# Patient Record
Sex: Female | Born: 1970 | Race: Black or African American | Hispanic: No | Marital: Married | Smoking: Current every day smoker
Health system: Southern US, Community
[De-identification: ages and names within clinical notes are randomized; demographics above are authoritative.]

## PROBLEM LIST (undated history)

## (undated) DIAGNOSIS — M549 Dorsalgia, unspecified: Secondary | ICD-10-CM

## (undated) DIAGNOSIS — I10 Essential (primary) hypertension: Secondary | ICD-10-CM

## (undated) DIAGNOSIS — F17200 Nicotine dependence, unspecified, uncomplicated: Secondary | ICD-10-CM

## (undated) DIAGNOSIS — E119 Type 2 diabetes mellitus without complications: Secondary | ICD-10-CM

## (undated) DIAGNOSIS — M419 Scoliosis, unspecified: Secondary | ICD-10-CM

## (undated) DIAGNOSIS — K219 Gastro-esophageal reflux disease without esophagitis: Secondary | ICD-10-CM

## (undated) DIAGNOSIS — E7800 Pure hypercholesterolemia, unspecified: Secondary | ICD-10-CM

## (undated) HISTORY — DX: Nicotine dependence, unspecified, uncomplicated: F17.200

## (undated) HISTORY — PX: ABDOMINAL HYSTERECTOMY: SHX81

## (undated) HISTORY — DX: Type 2 diabetes mellitus without complications: E11.9

## (undated) HISTORY — PX: ABDOMINAL SURGERY: SHX537

## (undated) HISTORY — PX: HAND SURGERY: SHX662

---

## 1997-11-13 ENCOUNTER — Emergency Department (HOSPITAL_COMMUNITY): Admission: EM | Admit: 1997-11-13 | Discharge: 1997-11-13 | Payer: Self-pay | Admitting: Emergency Medicine

## 1998-06-07 ENCOUNTER — Emergency Department (HOSPITAL_COMMUNITY): Admission: EM | Admit: 1998-06-07 | Discharge: 1998-06-07 | Payer: Self-pay | Admitting: Internal Medicine

## 1998-10-20 ENCOUNTER — Emergency Department (HOSPITAL_COMMUNITY): Admission: EM | Admit: 1998-10-20 | Discharge: 1998-10-20 | Payer: Self-pay | Admitting: Emergency Medicine

## 1999-06-28 ENCOUNTER — Emergency Department (HOSPITAL_COMMUNITY): Admission: EM | Admit: 1999-06-28 | Discharge: 1999-06-28 | Payer: Self-pay | Admitting: Emergency Medicine

## 1999-07-04 ENCOUNTER — Ambulatory Visit (HOSPITAL_COMMUNITY): Admission: RE | Admit: 1999-07-04 | Discharge: 1999-07-04 | Payer: Self-pay | Admitting: Internal Medicine

## 1999-07-04 ENCOUNTER — Encounter: Payer: Self-pay | Admitting: Internal Medicine

## 2000-05-28 ENCOUNTER — Other Ambulatory Visit: Admission: RE | Admit: 2000-05-28 | Discharge: 2000-05-28 | Payer: Self-pay | Admitting: Obstetrics and Gynecology

## 2003-06-19 ENCOUNTER — Emergency Department (HOSPITAL_COMMUNITY): Admission: EM | Admit: 2003-06-19 | Discharge: 2003-06-19 | Payer: Self-pay | Admitting: Emergency Medicine

## 2004-04-30 ENCOUNTER — Emergency Department (HOSPITAL_COMMUNITY): Admission: EM | Admit: 2004-04-30 | Discharge: 2004-04-30 | Payer: Self-pay | Admitting: Emergency Medicine

## 2004-05-30 ENCOUNTER — Ambulatory Visit (HOSPITAL_COMMUNITY): Admission: RE | Admit: 2004-05-30 | Discharge: 2004-05-30 | Payer: Self-pay | Admitting: Obstetrics and Gynecology

## 2004-08-27 ENCOUNTER — Emergency Department (HOSPITAL_COMMUNITY): Admission: EM | Admit: 2004-08-27 | Discharge: 2004-08-27 | Payer: Self-pay | Admitting: Emergency Medicine

## 2005-01-14 ENCOUNTER — Emergency Department (HOSPITAL_COMMUNITY): Admission: EM | Admit: 2005-01-14 | Discharge: 2005-01-14 | Payer: Self-pay | Admitting: Emergency Medicine

## 2007-02-06 ENCOUNTER — Emergency Department (HOSPITAL_COMMUNITY): Admission: EM | Admit: 2007-02-06 | Discharge: 2007-02-06 | Payer: Self-pay | Admitting: Emergency Medicine

## 2007-05-19 ENCOUNTER — Emergency Department (HOSPITAL_COMMUNITY): Admission: EM | Admit: 2007-05-19 | Discharge: 2007-05-19 | Payer: Self-pay | Admitting: Emergency Medicine

## 2008-10-30 ENCOUNTER — Emergency Department (HOSPITAL_COMMUNITY): Admission: EM | Admit: 2008-10-30 | Discharge: 2008-10-30 | Payer: Self-pay | Admitting: Emergency Medicine

## 2009-03-15 ENCOUNTER — Ambulatory Visit: Payer: Self-pay | Admitting: Internal Medicine

## 2009-03-15 ENCOUNTER — Encounter (INDEPENDENT_AMBULATORY_CARE_PROVIDER_SITE_OTHER): Payer: Self-pay | Admitting: Family Medicine

## 2009-03-15 LAB — CONVERTED CEMR LAB
ALT: 12 units/L (ref 0–35)
AST: 16 units/L (ref 0–37)
Basophils Relative: 1 % (ref 0–1)
Bilirubin, Direct: 0.1 mg/dL (ref 0.0–0.3)
Calcium: 9.6 mg/dL (ref 8.4–10.5)
Chloride: 107 meq/L (ref 96–112)
Creatinine, Ser: 0.65 mg/dL (ref 0.40–1.20)
Eosinophils Absolute: 0.3 10*3/uL (ref 0.0–0.7)
Hemoglobin: 13.7 g/dL (ref 12.0–15.0)
Lymphs Abs: 3.4 10*3/uL (ref 0.7–4.0)
MCV: 88.8 fL (ref 78.0–100.0)
Phosphorus: 3.5 mg/dL (ref 2.3–4.6)
Potassium: 4.3 meq/L (ref 3.5–5.3)
RBC: 4.63 M/uL (ref 3.87–5.11)
Total Bilirubin: 0.5 mg/dL (ref 0.3–1.2)
WBC: 6 10*3/uL (ref 4.0–10.5)

## 2009-04-12 ENCOUNTER — Encounter (INDEPENDENT_AMBULATORY_CARE_PROVIDER_SITE_OTHER): Payer: Self-pay | Admitting: Family Medicine

## 2009-04-12 ENCOUNTER — Ambulatory Visit: Payer: Self-pay | Admitting: Internal Medicine

## 2009-04-12 LAB — CONVERTED CEMR LAB
AST: 22 units/L (ref 0–37)
Albumin: 4.3 g/dL (ref 3.5–5.2)
Chloride: 103 meq/L (ref 96–112)
Creatinine, Ser: 0.6 mg/dL (ref 0.40–1.20)
Total Bilirubin: 0.5 mg/dL (ref 0.3–1.2)
VLDL: 25 mg/dL (ref 0–40)

## 2009-06-09 ENCOUNTER — Emergency Department (HOSPITAL_COMMUNITY): Admission: EM | Admit: 2009-06-09 | Discharge: 2009-06-09 | Payer: Self-pay | Admitting: Emergency Medicine

## 2009-06-09 ENCOUNTER — Ambulatory Visit: Payer: Self-pay | Admitting: Internal Medicine

## 2009-07-01 ENCOUNTER — Encounter (INDEPENDENT_AMBULATORY_CARE_PROVIDER_SITE_OTHER): Payer: Self-pay | Admitting: Family Medicine

## 2009-07-01 ENCOUNTER — Ambulatory Visit: Payer: Self-pay | Admitting: Internal Medicine

## 2009-07-01 LAB — CONVERTED CEMR LAB
BUN: 11 mg/dL (ref 6–23)
CO2: 22 meq/L (ref 19–32)
Chloride: 104 meq/L (ref 96–112)
Glucose, Bld: 95 mg/dL (ref 70–99)
Potassium: 3.6 meq/L (ref 3.5–5.3)

## 2009-10-04 ENCOUNTER — Ambulatory Visit: Payer: Self-pay | Admitting: Internal Medicine

## 2009-10-04 ENCOUNTER — Encounter (INDEPENDENT_AMBULATORY_CARE_PROVIDER_SITE_OTHER): Payer: Self-pay | Admitting: Family Medicine

## 2009-10-04 LAB — CONVERTED CEMR LAB
BUN: 14 mg/dL (ref 6–23)
Basophils Relative: 1 % (ref 0–1)
Calcium: 9.7 mg/dL (ref 8.4–10.5)
Chloride: 104 meq/L (ref 96–112)
Creatinine, Ser: 0.6 mg/dL (ref 0.40–1.20)
Eosinophils Absolute: 0.1 10*3/uL (ref 0.0–0.7)
Glucose, Bld: 103 mg/dL — ABNORMAL HIGH (ref 70–99)
HDL: 40 mg/dL (ref >39)
Lymphocytes Relative: 45 % (ref 12–46)
Monocytes Absolute: 0.3 10*3/uL (ref 0.1–1.0)
Neutro Abs: 2.5 10*3/uL (ref 1.7–7.7)
Platelets: 332 10*3/uL (ref 150–400)
Potassium: 3.5 meq/L (ref 3.5–5.3)
RDW: 14.6 % (ref 11.5–15.5)
VLDL: 22 mg/dL (ref 0–40)

## 2009-10-08 ENCOUNTER — Encounter: Admission: RE | Admit: 2009-10-08 | Discharge: 2009-10-08 | Payer: Self-pay | Admitting: Internal Medicine

## 2009-10-13 ENCOUNTER — Ambulatory Visit (HOSPITAL_COMMUNITY): Admission: RE | Admit: 2009-10-13 | Discharge: 2009-10-13 | Payer: Self-pay | Admitting: Internal Medicine

## 2009-11-18 ENCOUNTER — Encounter (INDEPENDENT_AMBULATORY_CARE_PROVIDER_SITE_OTHER): Payer: Self-pay | Admitting: Family Medicine

## 2009-11-18 LAB — CONVERTED CEMR LAB
ALT: 16 units/L (ref 0–35)
HDL: 37 mg/dL — ABNORMAL LOW (ref >39)
Triglycerides: 111 mg/dL (ref <150)

## 2009-12-14 ENCOUNTER — Emergency Department (HOSPITAL_COMMUNITY): Admission: EM | Admit: 2009-12-14 | Discharge: 2009-12-14 | Payer: Self-pay | Admitting: Family Medicine

## 2010-01-10 ENCOUNTER — Encounter (INDEPENDENT_AMBULATORY_CARE_PROVIDER_SITE_OTHER): Payer: Self-pay | Admitting: *Deleted

## 2010-01-21 ENCOUNTER — Inpatient Hospital Stay (HOSPITAL_COMMUNITY)
Admission: AD | Admit: 2010-01-21 | Discharge: 2010-01-21 | Payer: Self-pay | Source: Home / Self Care | Admitting: Obstetrics & Gynecology

## 2010-02-23 ENCOUNTER — Ambulatory Visit: Admit: 2010-02-23 | Payer: Self-pay | Admitting: Obstetrics & Gynecology

## 2010-03-12 ENCOUNTER — Other Ambulatory Visit: Payer: Self-pay | Admitting: Family Medicine

## 2010-03-12 DIAGNOSIS — N6310 Unspecified lump in the right breast, unspecified quadrant: Secondary | ICD-10-CM

## 2010-03-13 ENCOUNTER — Encounter: Payer: Self-pay | Admitting: Orthopedic Surgery

## 2010-03-13 ENCOUNTER — Encounter: Payer: Self-pay | Admitting: Internal Medicine

## 2010-03-25 ENCOUNTER — Other Ambulatory Visit: Payer: Self-pay | Admitting: Family Medicine

## 2010-03-25 ENCOUNTER — Ambulatory Visit
Admission: RE | Admit: 2010-03-25 | Discharge: 2010-03-25 | Disposition: A | Discharge status: Home / Self Care | Payer: BC Managed Care – PPO | Source: Ambulatory Visit | Attending: *Deleted | Admitting: *Deleted

## 2010-03-25 DIAGNOSIS — N63 Unspecified lump in unspecified breast: Secondary | ICD-10-CM

## 2010-03-25 DIAGNOSIS — N6310 Unspecified lump in the right breast, unspecified quadrant: Secondary | ICD-10-CM

## 2010-05-03 LAB — GC/CHLAMYDIA PROBE AMP, GENITAL: GC Probe Amp, Genital: NEGATIVE

## 2010-05-03 LAB — URINALYSIS, ROUTINE W REFLEX MICROSCOPIC
Bilirubin Urine: NEGATIVE
Glucose, UA: NEGATIVE mg/dL
Ketones, ur: NEGATIVE mg/dL
Leukocytes, UA: NEGATIVE
Specific Gravity, Urine: >1.03 — ABNORMAL HIGH (ref 1.005–1.030)

## 2010-05-03 LAB — CBC
HCT: 39.7 % (ref 36.0–46.0)
MCV: 89.7 fL (ref 78.0–100.0)
WBC: 6 10*3/uL (ref 4.0–10.5)

## 2010-05-03 LAB — WET PREP, GENITAL
Clue Cells Wet Prep HPF POC: NONE SEEN
Trich, Wet Prep: NONE SEEN
Yeast Wet Prep HPF POC: NONE SEEN

## 2010-05-06 ENCOUNTER — Encounter (HOSPITAL_COMMUNITY)
Admission: RE | Admit: 2010-05-06 | Discharge: 2010-05-06 | Disposition: A | Discharge status: Home / Self Care | Payer: BC Managed Care – PPO | Source: Ambulatory Visit | Attending: Obstetrics and Gynecology | Admitting: Obstetrics and Gynecology

## 2010-05-06 LAB — CBC
MCH: 29.1 pg (ref 26.0–34.0)
MCHC: 33.3 g/dL (ref 30.0–36.0)
MCV: 87.4 fL (ref 78.0–100.0)
Platelets: 305 10*3/uL (ref 150–400)

## 2010-05-06 LAB — DIFFERENTIAL
Eosinophils Relative: 3 % (ref 0–5)
Lymphocytes Relative: 44 % (ref 12–46)
Lymphs Abs: 2.4 10*3/uL (ref 0.7–4.0)
Monocytes Absolute: 0.4 10*3/uL (ref 0.1–1.0)
Monocytes Relative: 7 % (ref 3–12)
Neutro Abs: 2.5 10*3/uL (ref 1.7–7.7)

## 2010-05-06 LAB — COMPREHENSIVE METABOLIC PANEL
CO2: 25 mEq/L (ref 19–32)
Calcium: 9 mg/dL (ref 8.4–10.5)
Creatinine, Ser: 0.54 mg/dL (ref 0.4–1.2)
GFR calc non Af Amer: >60 mL/min (ref >60)
Glucose, Bld: 97 mg/dL (ref 70–99)

## 2010-05-06 LAB — SURGICAL PCR SCREEN: Staphylococcus aureus: NEGATIVE

## 2010-05-10 LAB — DIFFERENTIAL
Basophils Relative: 1 % (ref 0–1)
Monocytes Relative: 7 % (ref 3–12)
Neutro Abs: 3.1 10*3/uL (ref 1.7–7.7)
Neutrophils Relative %: 50 % (ref 43–77)

## 2010-05-10 LAB — BASIC METABOLIC PANEL
CO2: 24 mEq/L (ref 19–32)
Calcium: 9.2 mg/dL (ref 8.4–10.5)
Creatinine, Ser: 0.62 mg/dL (ref 0.4–1.2)
GFR calc Af Amer: >60 mL/min (ref >60)

## 2010-05-10 LAB — URINALYSIS, ROUTINE W REFLEX MICROSCOPIC
Protein, ur: NEGATIVE mg/dL
Urobilinogen, UA: 1 mg/dL (ref 0.0–1.0)

## 2010-05-10 LAB — CBC
MCHC: 34.6 g/dL (ref 30.0–36.0)
Platelets: 286 10*3/uL (ref 150–400)
RBC: 4.35 MIL/uL (ref 3.87–5.11)
WBC: 6.3 10*3/uL (ref 4.0–10.5)

## 2010-05-10 LAB — POCT CARDIAC MARKERS
CKMB, poc: <1 ng/mL — ABNORMAL LOW (ref 1.0–8.0)
Myoglobin, poc: 35.5 ng/mL (ref 12–200)
Myoglobin, poc: 39.3 ng/mL (ref 12–200)

## 2010-05-12 ENCOUNTER — Inpatient Hospital Stay (HOSPITAL_COMMUNITY)
Admission: RE | Admit: 2010-05-12 | Discharge: 2010-05-15 | DRG: 359 | Disposition: A | Discharge status: Home / Self Care | Payer: BC Managed Care – PPO | Source: Ambulatory Visit | Attending: Obstetrics and Gynecology | Admitting: Obstetrics and Gynecology

## 2010-05-12 ENCOUNTER — Other Ambulatory Visit: Payer: Self-pay | Admitting: Obstetrics and Gynecology

## 2010-05-12 DIAGNOSIS — IMO0002 Reserved for concepts with insufficient information to code with codable children: Secondary | ICD-10-CM | POA: Diagnosis present

## 2010-05-12 DIAGNOSIS — N803 Endometriosis of pelvic peritoneum, unspecified: Secondary | ICD-10-CM | POA: Diagnosis present

## 2010-05-12 DIAGNOSIS — D251 Intramural leiomyoma of uterus: Principal | ICD-10-CM | POA: Diagnosis present

## 2010-05-12 DIAGNOSIS — N9489 Other specified conditions associated with female genital organs and menstrual cycle: Secondary | ICD-10-CM | POA: Diagnosis present

## 2010-05-12 DIAGNOSIS — Z01818 Encounter for other preprocedural examination: Secondary | ICD-10-CM

## 2010-05-12 DIAGNOSIS — Z01812 Encounter for preprocedural laboratory examination: Secondary | ICD-10-CM

## 2010-05-12 DIAGNOSIS — N8000 Endometriosis of the uterus, unspecified: Secondary | ICD-10-CM | POA: Diagnosis present

## 2010-05-12 DIAGNOSIS — N946 Dysmenorrhea, unspecified: Secondary | ICD-10-CM | POA: Diagnosis present

## 2010-05-12 DIAGNOSIS — N920 Excessive and frequent menstruation with regular cycle: Secondary | ICD-10-CM | POA: Diagnosis present

## 2010-05-12 DIAGNOSIS — D252 Subserosal leiomyoma of uterus: Secondary | ICD-10-CM | POA: Diagnosis present

## 2010-05-12 LAB — CBC
HCT: 40.2 % (ref 36.0–46.0)
Hemoglobin: 13.3 g/dL (ref 12.0–15.0)
MCV: 87.4 fL (ref 78.0–100.0)
RBC: 4.6 MIL/uL (ref 3.87–5.11)
WBC: 17.9 10*3/uL — ABNORMAL HIGH (ref 4.0–10.5)

## 2010-05-13 LAB — CBC
HCT: 35.6 % — ABNORMAL LOW (ref 36.0–46.0)
Hemoglobin: 11.5 g/dL — ABNORMAL LOW (ref 12.0–15.0)
MCH: 28.5 pg (ref 26.0–34.0)
MCHC: 32.3 g/dL (ref 30.0–36.0)
MCV: 88.1 fL (ref 78.0–100.0)
RBC: 4.04 MIL/uL (ref 3.87–5.11)

## 2010-05-15 ENCOUNTER — Ambulatory Visit (HOSPITAL_COMMUNITY): Payer: BC Managed Care – PPO

## 2010-05-26 NOTE — Op Note (Signed)
NAMEMORAYMA, [name]                 ACCOUNT NO.:  [id number]  MEDICAL RECORD NO.:  [id number]           PATIENT TYPE:  O  LOCATION:  9302                          FACILITY:  [facility]  PHYSICIAN:  Malachi Pro. Ambrose Mantle, M.D. DATE OF BIRTH:  Feb 17, 1971  DATE OF PROCEDURE:  05/12/2010 DATE OF DISCHARGE:                              OPERATIVE REPORT   PREOPERATIVE DIAGNOSES:  Leiomyomata uteri, menorrhagia, dysmenorrhea, dyspareunia, abnormal uterine bleeding, history of endometriosis noted at the time of prior laparoscopy.  POSTOPERATIVE DIAGNOSES:  Leiomyomata uteri, menorrhagia, dysmenorrhea, dyspareunia, abnormal uterine bleeding, history of endometriosis noted at the time of prior laparoscopy.  OPERATIONS:  Abdominal hysterectomy, division of adhesions.  OPERATOR:  Malachi Pro. Ambrose Mantle, MD  ASSISTANT:  Zenaida Niece, MD  ANESTHESIA:  General anesthesia.  The patient was brought to the operating room and placed on the operating room table.  One look with the laryngoscope showed that it was a difficult intubation, so the laryngoscope was removed and video laryngoscope was used and no difficulty in intubating the patient.  The patient was placed in frog-leg position.  The abdomen, vulva, vagina and urethra were prepped with Betadine solution and a Foley catheter was inserted to straight drain.  Exam revealed what was thought to be a 14- 16 weeks' size uterus.  The adnexa were free of masses.  The patient was placed supine.  The abdomen was draped as a sterile field.  I had told the patient that I would use an incision slightly above her old incision to avoid being in the crease of her abdominal wall.  I made an incision on the upper flap of the abdominal wall, carried in layers through the skin and subcutaneous tissue and down to the fascia.  There was a little bit of scarring from her previous surgery.  The fascia was incised transversely, separated from the rectus muscle inferiorly  and superiorly.  Peritoneum was opened vertically.  I placed an Teacher, early years/pre.  First time, I thought I had some bowel or omentum cult so I used a retractor to place it of the second time.  Then I elevated the uterus out of the pelvic cavity onto the abdominal wall.  I used four packs to pack away the bowel which kept nudging into the operative field.  Inspection of the uterus revealed that the uterus was markedly enlarged with fibroids.  The right tube and ovary appeared relatively normal.  The left proximal section of the left tube seemed to be absent. The left ovary appeared normal, but there was some scarring around the tube and ovary on the left and these were divided.  There were some cul- de-sac adhesions that will later divided.  The upper pedicles were clamped across after the round ligaments bilaterally were divided with the Bovie and a bladder flap was developed.  A double ligature was placed across each upper pedicle.  The uterine vessels were then skeletonized bilaterally and skeletonizing the right uterine vessel actually ran into some bleeding that I needed to suture.  The parametrial and paracervical tissues were clamped, cut and suture ligated.  At this  point, the cul-de-sac adhesions were divided.  The uterosacral ligaments were clamped, cut, suture ligated and held and the vaginal angles were entered. Vaginal angle sutures were placed in the central portion of the vagina was closed with interrupted figure-of- eight sutures of 0 Vicryl.  There was no significant bleeding found.  I reapproximated the uterosacral ligaments in the midline with sutures through the uterosacral ligaments and the cul-de-sac peritoneum.  I then reperitonealized over the vaginal cuff.  I tended to what little bleeding there was and then I removed the Alexis retractor and the packs and then realized that I had not identified the ovaries, so I used some retractors to clear the pelvic walls,  identified both ureters normal in course and contour, normal in caliber.  At this point, there was some bleeding noted close to the left ovary which I sutured with 3-0 Vicryl with good hemostasis.  No other bleeding points were found.  I then closed the abdominal wall with interrupted sutures of 0 Vicryl to include the rectus muscle and the peritoneum, 2 running sutures of 0 Vicryl on the fascia.  A running 3-0 Vicryl on the subcu tissue and staples on the skin.  The patient seemed to tolerate the procedure well. Blood loss was estimated about 400 mL.  Sponge and needle counts were correct and she was returned to recovery in satisfactory condition.     Malachi Pro. Ambrose Mantle, M.D.     TFH/MEDQ  D:  05/12/2010  T:  05/12/2010  Job:  [id number]  Electronically Signed by Tracey Harries M.D. on 05/26/2010 08:48:28 AM

## 2010-05-26 NOTE — Discharge Summary (Signed)
[name], [name]                 ACCOUNT NO.:  [id number]  MEDICAL RECORD NO.:  [id number]           PATIENT TYPE:  O  LOCATION:  9302                          FACILITY:  [facility]  PHYSICIAN:  Malachi Pro. Ambrose Mantle, M.D. DATE OF BIRTH:  1971-01-12  DATE OF ADMISSION:  05/12/2010 DATE OF DISCHARGE:  05/15/2010                              DISCHARGE SUMMARY   This is a 40 year old black female who is admitted for fibroids, menorrhagia, dysmenorrhea, dyspareunia, abnormal uterine bleeding, and a history of endometriosis for abdominal hysterectomy.  She underwent abdominal hysterectomy with division of adhesions by Dr. Ambrose Mantle with Dr. Jackelyn Knife assisting under general anesthesia.  Blood loss was thought to be about 400 mL.  Findings were a fibroid uterus that in the operating room weighed 590 g.  There was cul-de-sac endometriosis that had been seen years before the time of her tubal ligation.  Normal ovaries. Proximal portion of the left tube absent.  Cul-de-sac adhesions were noted.  Postoperatively on the day of surgery, the patient had much more pain than I would expect.  I evaluated her.  Her blood pressures were in the normal range.  Her pulse was fast at 104-115.  Her urine output was excellent.  Abdomen was hard to examine, but did not appear distended, but it was slightly tender.  Blood count showed no evidence of hypovolemia.  We controlled the pain by increasing her morphine dose which was ineffective and then switching her to Dilaudid.  On the evening of the day of surgery, a temperature max was 100.5, on this evening of the first postop day it was 100.7.  The patient felt much better.  She was tolerating a diet, ambulating well, passing flatus, and voiding well.  Because of the elevated temp, I actually added doxycycline to her regimen of the Unasyn.  On the third postop day, she was afebrile, but since the evening prior she had experienced cramping abdominal pains and had quit  passing flatus, so I will order to flat and upright abdominal film which showed only colonic ileus.  No evidence of small bowel obstruction and the patient subsequently began to pass flatus.  She was considered ready for discharge and she was discharged to return to my office in 2 days for followup examination.  Staples were left in place.  The patient's initial hemoglobin was 13.4, hematocrit 40.2, white count 5500, platelet count 305,000, 45 segs, and 44 lymphs. Comprehensive metabolic profile was normal except for potassium of 3.4. Urine pregnancy test was negative.  On the day of surgery when she was having such severe pain, the hemoglobin was repeated and was 13.3 and hematocrit 40.2 and on the first postop day it was 11.5 and hematocrit 35.6.  Path report showed benign leiomyomata up to 7 cm in diameter, secretory phase endometrium, uterine adenomyosis, benign cervix, serosal endometriotic implants with calcified endosalpingiosis, and serosal adhesions.  OPERATION:  Abdominal hysterectomy, division of adhesions.  Flat and upright abdominal film on the day of discharge showed moderate stool and gas throughout and mildly dilated loops of colon.  There was paucity of small bowel gas, surgical  clips overlaid the lower abdomen, gas and stool was seen in the rectum.  IMPRESSION:  Findings most consistent with colonic ileus.  No evidence for small bowel obstruction.  FINAL DIAGNOSES: 1. Leiomyomata uteri. 2. Menorrhagia. 3. Dysmenorrhea. 4. Abnormal uterine bleeding. 5. Dyspareunia. 6. History of endometriosis. 7. Adenomyosis. 8. Serosal endometriosis.  OPERATION:  Abdominal hysterectomy and division of adhesions.  DISCHARGE MEDICATIONS:  Continue her Protonix, metformin, hydrochlorothiazide, and Lipitor.  Take doxycycline 100 mg twice daily for 5 days and Percocet 5/325, 30 tablets, 1 every 4-6 hours as needed for pain.  She is to return to the office in 2 days for followup  examination and removal of staples.  She is to report any fever above 104.4 degrees, report any unusual problems, avoid heavy lifting or strenuous activity, and no vaginal entrance.     Malachi Pro. Ambrose Mantle, M.D.     TFH/MEDQ  D:  05/15/2010  T:  05/16/2010  Job:  [id number]  Electronically Signed by Tracey Harries M.D. on 05/26/2010 08:48:16 AM

## 2010-05-26 NOTE — H&P (Addendum)
[name], [name]                 ACCOUNT NO.:  [id number]  MEDICAL RECORD NO.:  [id number]           PATIENT TYPE:  O  LOCATION:  SDC                           FACILITY:  [facility]  PHYSICIAN:  [name], M.D. DATE OF BIRTH:  10/26/1970  DATE OF ADMISSION:  05/12/2010 DATE OF DISCHARGE:                             HISTORY & PHYSICAL   PRESENT ILLNESS:  This is a 40 year old black female para 2-0-1-2, who is admitted to the hospital for abdominal hysterectomy because of abnormal bleeding, multiple fibroids, dysmenorrhea, dyspareunia and menorrhagia.  This patient has had a tubal pregnancy on the left, removed by salpingostomy.  She has had a cesarean section and a VBAC. She has also had tubal ligation.  I saw her in 2008 with essentially the same complaints stating she had significant dysmenorrhea, using 6-7 pads a day.  Her uterus was thought to be twice normal size and irregular.  I ask her to consider a hysterectomy and gave her a prescription for Anaprox.  I did not see her for 4 years.  She lost her job and went to [employer] and recently got a new job.  When I saw her April 11, 2010, she had complaints that her flow was heavy using at least 7-10 pads a day.  She rated her pain with her periods as 10 out of 10 and stated that intercourse was always painful regardless of position.  She returned for an endometrial biopsy that showed benign secretory endometrium.  An ultrasound had been done in January 2008 and showed multiple small fibroids.  The patient requested to proceed with hysterectomy.  Since that visit, she has had a period on April 21, 2010 that lasted 8 days and she claims she used 20 pads a day.  PAST MEDICAL HISTORY:  Reveals no known drug allergies.  She does have high blood pressure, acid reflux and borderline diabetes.  She had a tubal ligation in 1999, cesarean section in 1988, tubal pregnancy removed in 1987.  FAMILY HISTORY:  Mother with high blood  pressure, diabetes, stroke and heart attack.  Father with a heart attack and brother with possible high blood pressure.  The patient smokes a half-a-pack of cigarettes a day and drinks occasionally.  She does not take drugs.  She takes hydrochlorothiazide, Lipitor, Protonix and metformin.  PHYSICAL EXAM:  GENERAL:  Well-developed, well-nourished black female, in no distress. VITAL SIGNS:  Blood pressure is 148/100, weight is 183 pounds. HEAD, EYES, EARS, NOSE AND THROAT:  Normal. NECK:  Supple without thyromegaly. BREASTS:  Soft without masses. HEART:  Normal sinus rhythm, 2/6 systolic ejection murmur. LUNGS:  Clear to auscultation. ABDOMEN:  Soft, obese.  No masses.  Liver, spleen and kidneys not felt. The vulva and vagina are clean.  Cervix is clean.  Pap smear on February 2012 was negative for malignancy.  The uterus is irregular thought to be about 12-week size, but somewhat difficult to tell the exact size because it is midplane.  The adnexa are free of masses. RECTAL:  On April 11, 2010 was negative.  ADMITTING IMPRESSION:  Abnormal uterine bleeding, leiomyomata uteri,  dysmenorrhea, dyspareunia and menorrhagia.  The patient is admitted for abdominal hysterectomy.  She understand the risk of surgery including, but not limited to heart attack, stroke, pulmonary embolus, wound disruption, hemorrhage with need for reoperation and/or transfusion, fistula formation, nerve injury, intestinal obstruction.  She also understands as an unpredictable impact on her sex drive, which she states is very low at the present time.  I have told her that the correction of dyspareunia is not certain, but I have a strong feeling that the dyspareunia will be improved.  The menorrhagia will be gone and the fibroids will be gone. She understands and agrees to proceed.     [name], M.D.     TFH/MEDQ  D:  05/11/2010  T:  05/11/2010  Job:  [id number]  Electronically Signed by [name] M.D. on 06/24/2010 09:11:28 AM

## 2010-06-10 ENCOUNTER — Other Ambulatory Visit: Payer: Self-pay | Admitting: Internal Medicine

## 2010-06-10 DIAGNOSIS — N63 Unspecified lump in unspecified breast: Secondary | ICD-10-CM

## 2010-06-13 ENCOUNTER — Other Ambulatory Visit: Payer: Self-pay | Admitting: Internal Medicine

## 2010-06-13 DIAGNOSIS — N63 Unspecified lump in unspecified breast: Secondary | ICD-10-CM

## 2010-08-16 ENCOUNTER — Ambulatory Visit: Payer: BC Managed Care – PPO | Admitting: Internal Medicine

## 2010-08-22 ENCOUNTER — Other Ambulatory Visit: Payer: Self-pay | Admitting: Obstetrics and Gynecology

## 2010-08-22 DIAGNOSIS — R921 Mammographic calcification found on diagnostic imaging of breast: Secondary | ICD-10-CM

## 2010-12-04 ENCOUNTER — Emergency Department (HOSPITAL_COMMUNITY)
Admission: EM | Admit: 2010-12-04 | Discharge: 2010-12-04 | Disposition: A | Discharge status: Home / Self Care | Payer: BC Managed Care – PPO | Attending: Emergency Medicine | Admitting: Emergency Medicine

## 2010-12-04 DIAGNOSIS — I10 Essential (primary) hypertension: Secondary | ICD-10-CM | POA: Insufficient documentation

## 2010-12-04 DIAGNOSIS — E7800 Pure hypercholesterolemia, unspecified: Secondary | ICD-10-CM | POA: Insufficient documentation

## 2010-12-04 DIAGNOSIS — K219 Gastro-esophageal reflux disease without esophagitis: Secondary | ICD-10-CM | POA: Insufficient documentation

## 2010-12-04 DIAGNOSIS — R51 Headache: Secondary | ICD-10-CM | POA: Insufficient documentation

## 2010-12-04 DIAGNOSIS — Z79899 Other long term (current) drug therapy: Secondary | ICD-10-CM | POA: Insufficient documentation

## 2011-04-04 ENCOUNTER — Encounter (HOSPITAL_COMMUNITY): Payer: Self-pay

## 2011-04-04 ENCOUNTER — Emergency Department (INDEPENDENT_AMBULATORY_CARE_PROVIDER_SITE_OTHER)
Admission: EM | Admit: 2011-04-04 | Discharge: 2011-04-04 | Disposition: A | Discharge status: Home / Self Care | Payer: BC Managed Care – PPO | Source: Home / Self Care | Attending: Family Medicine | Admitting: Family Medicine

## 2011-04-04 DIAGNOSIS — K5289 Other specified noninfective gastroenteritis and colitis: Secondary | ICD-10-CM

## 2011-04-04 DIAGNOSIS — K529 Noninfective gastroenteritis and colitis, unspecified: Secondary | ICD-10-CM

## 2011-04-04 HISTORY — DX: Essential (primary) hypertension: I10

## 2011-04-04 HISTORY — DX: Pure hypercholesterolemia, unspecified: E78.00

## 2011-04-04 MED ORDER — ONDANSETRON HCL 4 MG PO TABS: 4.0000 mg | ORAL_TABLET | Freq: Four times a day (QID) | ORAL | Status: AC

## 2011-04-04 MED ORDER — ONDANSETRON 4 MG PO TBDP
ORAL_TABLET | ORAL | Status: AC
  Filled 2011-04-04: qty 1

## 2011-04-04 MED ORDER — ONDANSETRON 4 MG PO TBDP
4.0000 mg | ORAL_TABLET | Freq: Once | ORAL | Status: AC
  Administered 2011-04-04: 4 mg via ORAL

## 2011-04-04 NOTE — Discharge Instructions (Signed)
Clear liquid today , bland diet tonight as tolerated, advance  as improved, use medicine as needed for nausea and imodium for diarrhea, return or see your doctor if any problems.

## 2011-04-04 NOTE — ED Provider Notes (Signed)
History     CSN: [id number]  Arrival date & time 04/04/11  7829   First MD Initiated Contact with Patient 04/04/11 0840      No chief complaint on file.   (Consider location/radiation/quality/duration/timing/severity/associated sxs/prior treatment) Patient is a 41 y.o. female presenting with diarrhea. The history is provided by the patient.  Diarrhea The primary symptoms include fatigue, nausea, diarrhea and myalgias. Primary symptoms do not include fever, vomiting or dysuria. The illness began yesterday.  The illness is also significant for chills. Associated symptoms comments: Has not vomited yet, feels very nauseated.Marland Kitchen    No past medical history on file.  No past surgical history on file.  No family history on file.  History  Substance Use Topics  . Smoking status: Not on file  . Smokeless tobacco: Not on file  . Alcohol Use: Not on file    OB History    No data available      Review of Systems  Constitutional: Positive for chills and fatigue. Negative for fever.  HENT: Negative.   Respiratory: Negative for cough.   Gastrointestinal: Positive for nausea and diarrhea. Negative for vomiting.  Genitourinary: Negative for dysuria.  Musculoskeletal: Positive for myalgias.    Allergies  Review of patient's allergies indicates no known allergies.  Home Medications   Current Outpatient Rx  Name Route Sig Dispense Refill  . ONDANSETRON HCL 4 MG PO TABS Oral Take 1 tablet (4 mg total) by mouth every 6 (six) hours. 8 tablet 0    BP 140/98  Pulse 80  Temp(Src) 98 F (36.7 C) (Oral)  Resp 17  SpO2 98%  Physical Exam  Nursing note and vitals reviewed. Constitutional: She is oriented to person, place, and time. She appears well-developed and well-nourished.  HENT:  Head: Normocephalic.  Mouth/Throat: Oropharynx is clear and moist.  Eyes: Pupils are equal, round, and reactive to light.  Neck: Normal range of motion. Neck supple.  Cardiovascular: Normal rate.    Pulmonary/Chest: Breath sounds normal.  Abdominal: Soft. Bowel sounds are normal. She exhibits no distension and no mass. There is tenderness. There is no rebound and no guarding.       Mild diffuse soreness, no acute pain.  Neurological: She is alert and oriented to person, place, and time.  Skin: Skin is warm and dry.    ED Course  Procedures (including critical care time)  Labs Reviewed - No data to display No results found.   1. Gastroenteritis, acute       MDM          Barkley Bruns, MD 04/04/11 [phone]

## 2011-04-04 NOTE — ED Notes (Signed)
C/o nausea and diarrhea since last night.  Also having muscle aches and headache.  Denies vomiting or fever.

## 2011-05-30 ENCOUNTER — Emergency Department (HOSPITAL_COMMUNITY)
Admission: EM | Admit: 2011-05-30 | Discharge: 2011-05-30 | Discharge status: Against Medical Advice | Payer: BC Managed Care – PPO | Attending: Emergency Medicine | Admitting: Emergency Medicine

## 2011-05-30 ENCOUNTER — Encounter (HOSPITAL_COMMUNITY): Payer: Self-pay | Admitting: Emergency Medicine

## 2011-05-30 DIAGNOSIS — M549 Dorsalgia, unspecified: Secondary | ICD-10-CM | POA: Insufficient documentation

## 2011-05-30 HISTORY — DX: Scoliosis, unspecified: M41.9

## 2011-05-30 NOTE — ED Notes (Signed)
Pt states she is having pain in her back  Pt states she has scoliosis  Pt states she does heavy lifting at work  Pt states her pain is in her lower back that radiates into her left buttock  Pt states this is the fourth day it has been hurting

## 2011-05-30 NOTE — ED Notes (Signed)
Pt got out of room, states "I'm going home, this is ridiculous... I have been waiting for so long".  Explained delay but pt just walked away.

## 2011-06-01 ENCOUNTER — Encounter (HOSPITAL_COMMUNITY): Payer: Self-pay

## 2011-06-01 ENCOUNTER — Emergency Department (HOSPITAL_COMMUNITY)
Admission: EM | Admit: 2011-06-01 | Discharge: 2011-06-01 | Disposition: A | Discharge status: Home / Self Care | Payer: BC Managed Care – PPO | Source: Home / Self Care | Attending: Emergency Medicine | Admitting: Emergency Medicine

## 2011-06-01 DIAGNOSIS — S336XXA Sprain of sacroiliac joint, initial encounter: Secondary | ICD-10-CM

## 2011-06-01 DIAGNOSIS — IMO0002 Reserved for concepts with insufficient information to code with codable children: Secondary | ICD-10-CM

## 2011-06-01 HISTORY — DX: Gastro-esophageal reflux disease without esophagitis: K21.9

## 2011-06-01 HISTORY — DX: Dorsalgia, unspecified: M54.9

## 2011-06-01 MED ORDER — IBUPROFEN 600 MG PO TABS: 600.0000 mg | ORAL_TABLET | Freq: Three times a day (TID) | ORAL | Status: DC | PRN

## 2011-06-01 MED ORDER — HYDROCODONE-ACETAMINOPHEN 5-325 MG PO TABS: 2.0000 | ORAL_TABLET | ORAL | Status: AC | PRN

## 2011-06-01 MED ORDER — PREDNISONE 20 MG PO TABS: ORAL_TABLET | ORAL | Status: AC

## 2011-06-01 MED ORDER — METHOCARBAMOL 500 MG PO TABS: 500.0000 mg | ORAL_TABLET | Freq: Four times a day (QID) | ORAL | Status: AC

## 2011-06-01 NOTE — Discharge Instructions (Signed)
Take the medication as written. Take 1 gram of tylenol with the motrin up to 4 times a day as needed for pain and fever. This is an effective combination for pain. Take the hydrocodone/norco only for severe pain. Do not take the tylenol and hydrocodone/norcoas they both have tylenol in them and too much can hurt your liver. Return if you get worse, have a  fever >100.4, or for any concerns.   Go to www.goodrx.com to look up your medications. This will give you a list of where you can find your prescriptions at the most affordable prices.   

## 2011-06-01 NOTE — ED Provider Notes (Signed)
History     CSN: [id number]  Arrival date & time 06/01/11  1191   First MD Initiated Contact with Patient 06/01/11 [phone]      Chief Complaint  Patient presents with  . Back Pain    (Consider location/radiation/quality/duration/timing/severity/associated sxs/prior treatment) HPI Comments: She states that she has had similar symptoms before. States these symptoms started after doing some heavy lifting. Patient states that she bent over to put things up, and does not use proper lifting technique.  Patient is a 41 y.o. female presenting with back pain. The history is provided by the patient. No language interpreter was used.  Back Pain  This is a recurrent problem. The current episode started more than 1 week ago. The problem occurs daily. The pain is associated with twisting. The pain is present in the sacro-iliac joint. The quality of the pain is described as aching. The pain does not radiate. The symptoms are aggravated by bending, twisting and certain positions. The pain is worse during the day. Pertinent negatives include no chest pain, no fever, no numbness, no abdominal pain, no abdominal swelling, no bowel incontinence, no perianal numbness, no dysuria, no pelvic pain, no leg pain, no paresthesias, no paresis, no tingling and no weakness. She has tried NSAIDs for the symptoms. The treatment provided mild relief. Risk factors include obesity and poor posture.    Past Medical History  Diagnosis Date  . Hypertension   . Migraine   . High cholesterol   . Scoliosis   . GERD (gastroesophageal reflux disease)   . Back pain     Past Surgical History  Procedure Date  . Abdominal hysterectomy   . Cesarean section     Family History  Problem Relation Age of Onset  . Hypertension Mother   . Stroke Mother   . Cancer Mother   . Diabetes Mother   . Heart attack Father     History  Substance Use Topics  . Smoking status: Current Everyday Smoker -- 1.0 packs/day    Types:  Cigarettes  . Smokeless tobacco: Not on file  . Alcohol Use: No     social    OB History    Grav Para Term Preterm Abortions TAB SAB Ect Mult Living                  Review of Systems  Constitutional: Negative for fever.  Cardiovascular: Negative for chest pain.  Gastrointestinal: Negative for abdominal pain and bowel incontinence.  Genitourinary: Negative for dysuria and pelvic pain.  Musculoskeletal: Positive for back pain.  Neurological: Negative for tingling, weakness, numbness and paresthesias.    Allergies  Review of patient's allergies indicates no known allergies.  Home Medications   Current Outpatient Rx  Name Route Sig Dispense Refill  . METFORMIN HCL PO Oral Take by mouth See admin instructions. Patient takes metforming for weight loss as needed last dose was last week.    Marland Kitchen PROTONIX PO Oral Take by mouth daily.    Marland Kitchen SIMVASTATIN 20 MG PO TABS Oral Take 20 mg by mouth at bedtime.    . SUMATRIPTAN SUCCINATE 50 MG PO TABS Oral Take 50 mg by mouth every 2 (two) hours as needed. Severe frequent migraines    . HYDROCODONE-ACETAMINOPHEN 5-325 MG PO TABS Oral Take 2 tablets by mouth every 4 (four) hours as needed for pain. 20 tablet 0  . IBUPROFEN 600 MG PO TABS Oral Take 1 tablet (600 mg total) by mouth every 8 (eight) hours as  needed for pain. 30 tablet 0  . METHOCARBAMOL 500 MG PO TABS Oral Take 1 tablet (500 mg total) by mouth 4 (four) times daily. 40 tablet 0  . PREDNISONE 20 MG PO TABS  Take 3 tabs po on first day, 2 tabs second day, 2 tabs third day, 1 tab fourth day, 1 tab 5th day. Take with food. 9 tablet 0    BP 123/79  Pulse 84  Temp(Src) 98.4 F (36.9 C) (Oral)  Resp 14  SpO2 98%  Physical Exam  Nursing note and vitals reviewed. Constitutional: She is oriented to person, place, and time. She appears well-developed and well-nourished.       Appears mildly uncomfortable  HENT:  Head: Normocephalic and atraumatic.  Eyes: Conjunctivae and EOM are normal.    Neck: Normal range of motion.  Cardiovascular: Normal rate.   Pulmonary/Chest: Effort normal.  Abdominal: Normal appearance and bowel sounds are normal. She exhibits no distension. There is no tenderness. There is no CVA tenderness.  Musculoskeletal: Normal range of motion.       Bilateral lower extremities nontender, baseline ROM with intact DP pulses, CR<2 secs all digits bilaterally. No pain with PROM hips bilaterally. Pain aggravated with hip flexion, but SLR neg bilaterally. Sensation baseline light touch bilaterally for Pt, DTR's symmetric and intact bilaterally KJ, Motor symmetric bilateral 5/5 hip flexion, quadriceps, hamstrings, EHL, foot dorsiflexion, foot plantarflexion, gait somewhat antalgic but without apparent new ataxia.   Neurological: She is alert and oriented to person, place, and time.  Skin: Skin is warm and dry.  Psychiatric: She has a normal mood and affect. Her behavior is normal. Judgment and thought content normal.    ED Course  Procedures (including critical care time)  Labs Reviewed - No data to display No results found.   1. Sprain and strain of sacroiliac joint      MDM  Previous chart, labs, imaging reviewed. Pt was in ED 2 days ago for same complaint but LWOB.     Luiz Blare, MD 06/01/11 [phone]

## 2011-06-01 NOTE — ED Notes (Signed)
C/o low back pain with pain down into lt buttocks.  Sx for 6-7 days.  States she lifts a lot at work.  States she has had back pain previously.

## 2011-10-05 ENCOUNTER — Other Ambulatory Visit: Payer: Self-pay | Admitting: Obstetrics and Gynecology

## 2011-10-05 DIAGNOSIS — R921 Mammographic calcification found on diagnostic imaging of breast: Secondary | ICD-10-CM

## 2011-10-13 ENCOUNTER — Ambulatory Visit
Admission: RE | Admit: 2011-10-13 | Discharge: 2011-10-13 | Disposition: A | Discharge status: Home / Self Care | Payer: BC Managed Care – PPO | Source: Ambulatory Visit | Attending: Obstetrics and Gynecology | Admitting: Obstetrics and Gynecology

## 2011-10-13 DIAGNOSIS — R921 Mammographic calcification found on diagnostic imaging of breast: Secondary | ICD-10-CM

## 2012-09-20 ENCOUNTER — Other Ambulatory Visit: Payer: Self-pay | Admitting: Internal Medicine

## 2012-09-20 DIAGNOSIS — R6881 Early satiety: Secondary | ICD-10-CM

## 2012-09-25 ENCOUNTER — Encounter (HOSPITAL_COMMUNITY): Payer: Self-pay | Admitting: Emergency Medicine

## 2012-09-25 ENCOUNTER — Emergency Department (HOSPITAL_COMMUNITY)
Admission: EM | Admit: 2012-09-25 | Discharge: 2012-09-25 | Disposition: A | Discharge status: Home / Self Care | Payer: BC Managed Care – PPO | Attending: Emergency Medicine | Admitting: Emergency Medicine

## 2012-09-25 DIAGNOSIS — Z8739 Personal history of other diseases of the musculoskeletal system and connective tissue: Secondary | ICD-10-CM | POA: Insufficient documentation

## 2012-09-25 DIAGNOSIS — H53149 Visual discomfort, unspecified: Secondary | ICD-10-CM | POA: Insufficient documentation

## 2012-09-25 DIAGNOSIS — Z79899 Other long term (current) drug therapy: Secondary | ICD-10-CM | POA: Insufficient documentation

## 2012-09-25 DIAGNOSIS — I10 Essential (primary) hypertension: Secondary | ICD-10-CM | POA: Insufficient documentation

## 2012-09-25 DIAGNOSIS — Z3202 Encounter for pregnancy test, result negative: Secondary | ICD-10-CM | POA: Insufficient documentation

## 2012-09-25 DIAGNOSIS — R809 Proteinuria, unspecified: Secondary | ICD-10-CM | POA: Insufficient documentation

## 2012-09-25 DIAGNOSIS — Z8679 Personal history of other diseases of the circulatory system: Secondary | ICD-10-CM | POA: Insufficient documentation

## 2012-09-25 DIAGNOSIS — R112 Nausea with vomiting, unspecified: Secondary | ICD-10-CM | POA: Insufficient documentation

## 2012-09-25 DIAGNOSIS — Z8719 Personal history of other diseases of the digestive system: Secondary | ICD-10-CM | POA: Insufficient documentation

## 2012-09-25 DIAGNOSIS — R519 Headache, unspecified: Secondary | ICD-10-CM

## 2012-09-25 DIAGNOSIS — R42 Dizziness and giddiness: Secondary | ICD-10-CM | POA: Insufficient documentation

## 2012-09-25 DIAGNOSIS — F17200 Nicotine dependence, unspecified, uncomplicated: Secondary | ICD-10-CM | POA: Insufficient documentation

## 2012-09-25 DIAGNOSIS — R51 Headache: Secondary | ICD-10-CM | POA: Insufficient documentation

## 2012-09-25 DIAGNOSIS — E7800 Pure hypercholesterolemia, unspecified: Secondary | ICD-10-CM | POA: Insufficient documentation

## 2012-09-25 LAB — URINALYSIS, ROUTINE W REFLEX MICROSCOPIC
Bilirubin Urine: NEGATIVE
Glucose, UA: NEGATIVE mg/dL
Specific Gravity, Urine: 1.016 (ref 1.005–1.030)
pH: 8 (ref 5.0–8.0)

## 2012-09-25 LAB — TROPONIN I: Troponin I: <0.3 ng/mL (ref <0.30)

## 2012-09-25 LAB — CBC WITH DIFFERENTIAL/PLATELET
Basophils Absolute: 0 10*3/uL (ref 0.0–0.1)
Basophils Relative: 1 % (ref 0–1)
Eosinophils Absolute: 0.3 10*3/uL (ref 0.0–0.7)
Eosinophils Relative: 5 % (ref 0–5)
Lymphocytes Relative: 47 % — ABNORMAL HIGH (ref 12–46)
MCHC: 35.5 g/dL (ref 30.0–36.0)
MCV: 89.8 fL (ref 78.0–100.0)
Platelets: 290 10*3/uL (ref 150–400)
RDW: 13.5 % (ref 11.5–15.5)
WBC: 5.5 10*3/uL (ref 4.0–10.5)

## 2012-09-25 LAB — COMPREHENSIVE METABOLIC PANEL
ALT: 24 U/L (ref 0–35)
AST: 25 U/L (ref 0–37)
Albumin: 3.8 g/dL (ref 3.5–5.2)
CO2: 27 mEq/L (ref 19–32)
Calcium: 9.8 mg/dL (ref 8.4–10.5)
Sodium: 139 mEq/L (ref 135–145)
Total Protein: 7.3 g/dL (ref 6.0–8.3)

## 2012-09-25 LAB — POCT PREGNANCY, URINE: Preg Test, Ur: NEGATIVE

## 2012-09-25 MED ORDER — PROCHLORPERAZINE EDISYLATE 5 MG/ML IJ SOLN
10.0000 mg | Freq: Once | INTRAMUSCULAR | Status: AC
  Administered 2012-09-25: 10 mg via INTRAVENOUS
  Filled 2012-09-25: qty 2

## 2012-09-25 MED ORDER — DIPHENHYDRAMINE HCL 50 MG/ML IJ SOLN
25.0000 mg | Freq: Once | INTRAMUSCULAR | Status: AC
  Administered 2012-09-25: 25 mg via INTRAVENOUS
  Filled 2012-09-25: qty 1

## 2012-09-25 MED ORDER — SODIUM CHLORIDE 0.9 % IV BOLUS (SEPSIS)
1000.0000 mL | Freq: Once | INTRAVENOUS | Status: AC
  Administered 2012-09-25: 1000 mL via INTRAVENOUS

## 2012-09-25 NOTE — ED Notes (Signed)
Family at bedside. 

## 2012-09-25 NOTE — Discharge Instructions (Signed)
As discussed, it is important to follow up with your physician in one week for repeat blood draw, repeat evaluation, discussion of appropriate medication.  Please be sure to follow up with a neurologist as well for additional evaluation of your headaches.  Return here for any concerning changes in your condition.

## 2012-09-25 NOTE — ED Notes (Signed)
Pt c/o HA x 2 weeks with nausea and htn since having BP meds changed 3 weeks ago

## 2012-09-25 NOTE — ED Provider Notes (Signed)
CSN: [id number]     Arrival date & time 09/25/12  1231 History     First MD Initiated Contact with Patient 09/25/12 1324     Chief Complaint  Patient presents with  . Headache  . Hypertension    HPI  Patient presents with 2 chief complaints. Chief complaint #1 headache. She has a history of migraine. She states over the past weeks and in particular the past week her headache has become more severe.  The headache is diffuse, whereas the typical migraines are unilateral.  However, characteristically the headache is similar to other prior events.  She complains of photophobia, nausea, generalized sense of being unwell. Concurrently, the patient has had difficulty controlling her blood pressure.  She states that over the past week she has had significantly elevated readings in spite of doubling her blood pressure medication. She denies new confusion, dislocation, fall, chest pain, dyspnea. No relief of the headache with her typical medication. No clear exacerbating factors.  Past Medical History  Diagnosis Date  . Hypertension   . Migraine   . High cholesterol   . Scoliosis   . GERD (gastroesophageal reflux disease)   . Back pain    Past Surgical History  Procedure Laterality Date  . Abdominal hysterectomy    . Cesarean section     Family History  Problem Relation Age of Onset  . Hypertension Mother   . Stroke Mother   . Cancer Mother   . Diabetes Mother   . Heart attack Father    History  Substance Use Topics  . Smoking status: Current Every Day Smoker -- 1.00 packs/day    Types: Cigarettes  . Smokeless tobacco: Not on file  . Alcohol Use: No     Comment: social   OB History   Grav Para Term Preterm Abortions TAB SAB Ect Mult Living                 Review of Systems  Constitutional:       Per HPI, otherwise negative  HENT:       Per HPI, otherwise negative  Respiratory:       Per HPI, otherwise negative  Cardiovascular:       Per HPI, otherwise negative   Gastrointestinal: Positive for nausea and vomiting.  Endocrine:       Negative aside from HPI  Genitourinary:       Neg aside from HPI   Musculoskeletal:       Per HPI, otherwise negative  Skin: Negative.   Neurological: Positive for light-headedness and headaches. Negative for syncope.    Allergies  Review of patient's allergies indicates no known allergies.  Home Medications   Current Outpatient Rx  Name  Route  Sig  Dispense  Refill  . Aspirin-Acetaminophen (GOODYS BODY PAIN PO)   Oral   Take 1 each by mouth daily as needed (pain).         Marland Kitchen ibuprofen (ADVIL,MOTRIN) 600 MG tablet   Oral   Take 600 mg by mouth every 8 (eight) hours as needed for pain.         Marland Kitchen lisinopril-hydrochlorothiazide (PRINZIDE,ZESTORETIC) 20-12.5 MG per tablet   Oral   Take 2 tablets by mouth daily.         . simvastatin (ZOCOR) 20 MG tablet   Oral   Take 20 mg by mouth at bedtime.          BP 152/97  Pulse 83  Temp(Src) 98.6 F (37 C)  Resp 16  SpO2 95% Physical Exam  Nursing note and vitals reviewed. Constitutional: She is oriented to person, place, and time. She appears well-developed and well-nourished. No distress.  HENT:  Head: Normocephalic and atraumatic.  Eyes: Conjunctivae and EOM are normal.  Neck: Normal range of motion. Neck supple. No tracheal deviation present.  Cardiovascular: Normal rate and regular rhythm.   Pulmonary/Chest: Effort normal and breath sounds normal. No stridor. No respiratory distress.  Abdominal: She exhibits no distension.  Musculoskeletal: She exhibits no edema.  Neurological: She is alert and oriented to person, place, and time. No cranial nerve deficit. She exhibits normal muscle tone. Coordination normal.  Skin: Skin is warm and dry.  Psychiatric: She has a normal mood and affect.    ED Course   Procedures (including critical care time)  Labs Reviewed  CBC WITH DIFFERENTIAL - Abnormal; Notable for the following:    Neutrophils  Relative % 40 (*)    Lymphocytes Relative 47 (*)    All other components within normal limits  COMPREHENSIVE METABOLIC PANEL - Abnormal; Notable for the following:    Glucose, Bld 109 (*)    All other components within normal limits  URINALYSIS, ROUTINE W REFLEX MICROSCOPIC - Abnormal; Notable for the following:    APPearance CLOUDY (*)    Hgb urine dipstick MODERATE (*)    All other components within normal limits  URINE MICROSCOPIC-ADD ON - Abnormal; Notable for the following:    Squamous Epithelial / LPF FEW (*)    All other components within normal limits  CBC WITH DIFFERENTIAL  COMPREHENSIVE METABOLIC PANEL  TROPONIN I  POCT PREGNANCY, URINE   No results found. No diagnosis found. Pulse ox 99% room air normal   Date: 09/25/2012  Rate: 73  Rhythm: normal sinus rhythm  QRS Axis: normal  Intervals: normal  ST/T Wave abnormalities: nonspecific T wave changes  Conduction Disutrbances:none  Narrative Interpretation:   Old EKG Reviewed: none available BORDERLINE  3:29 PM Patient appears better. I discussed the results with her and her husband.  Specifically we discussed proteinuria.  We also discussed the need to follow up with primary care physician for repeat blood check this week, and consideration of renal ultrasound.   MDM  Patient presents with concerns of ongoing headache, hypertension. On exam she is awake and alert, with no disorientation, or focal neurologic changes.  Patient presents with medication here.  Evaluation is notable for demonstration of proteinuria, suggestive of chronic hypertension.  Patient was appropriate for discharge following improvement in her headache.  Exposer return precautions, follow instructions provided.  Gerhard Munch, MD 09/25/12 1530

## 2012-09-27 ENCOUNTER — Ambulatory Visit
Admission: RE | Admit: 2012-09-27 | Discharge: 2012-09-27 | Disposition: A | Discharge status: Home / Self Care | Payer: BC Managed Care – PPO | Source: Ambulatory Visit | Attending: Internal Medicine | Admitting: Internal Medicine

## 2012-09-27 ENCOUNTER — Other Ambulatory Visit: Payer: Self-pay | Admitting: Internal Medicine

## 2012-09-27 DIAGNOSIS — R6881 Early satiety: Secondary | ICD-10-CM

## 2012-10-02 ENCOUNTER — Other Ambulatory Visit: Payer: BC Managed Care – PPO

## 2012-10-02 ENCOUNTER — Inpatient Hospital Stay: Admission: RE | Admit: 2012-10-02 | Payer: BC Managed Care – PPO | Source: Ambulatory Visit

## 2012-10-15 ENCOUNTER — Emergency Department (HOSPITAL_COMMUNITY)
Admission: EM | Admit: 2012-10-15 | Discharge: 2012-10-15 | Disposition: A | Discharge status: Home / Self Care | Payer: BC Managed Care – PPO | Attending: Emergency Medicine | Admitting: Emergency Medicine

## 2012-10-15 ENCOUNTER — Encounter (HOSPITAL_COMMUNITY): Payer: Self-pay | Admitting: Emergency Medicine

## 2012-10-15 DIAGNOSIS — I10 Essential (primary) hypertension: Secondary | ICD-10-CM | POA: Insufficient documentation

## 2012-10-15 DIAGNOSIS — Z8719 Personal history of other diseases of the digestive system: Secondary | ICD-10-CM | POA: Insufficient documentation

## 2012-10-15 DIAGNOSIS — Z8739 Personal history of other diseases of the musculoskeletal system and connective tissue: Secondary | ICD-10-CM | POA: Insufficient documentation

## 2012-10-15 DIAGNOSIS — E7800 Pure hypercholesterolemia, unspecified: Secondary | ICD-10-CM | POA: Insufficient documentation

## 2012-10-15 DIAGNOSIS — F17200 Nicotine dependence, unspecified, uncomplicated: Secondary | ICD-10-CM | POA: Insufficient documentation

## 2012-10-15 DIAGNOSIS — R21 Rash and other nonspecific skin eruption: Secondary | ICD-10-CM | POA: Insufficient documentation

## 2012-10-15 DIAGNOSIS — Z79899 Other long term (current) drug therapy: Secondary | ICD-10-CM | POA: Insufficient documentation

## 2012-10-15 DIAGNOSIS — Z8679 Personal history of other diseases of the circulatory system: Secondary | ICD-10-CM | POA: Insufficient documentation

## 2012-10-15 MED ORDER — PERMETHRIN 5 % EX CREA: TOPICAL_CREAM | CUTANEOUS | Status: DC

## 2012-10-15 NOTE — ED Notes (Signed)
Pt here with rash to groin area and back and breast area x several weeks that is itchy; pt sts started 2 new meds recently butunsure if started at same time

## 2012-10-15 NOTE — ED Provider Notes (Signed)
CSN: [id number]     Arrival date & time 10/15/12  1119 History   First MD Initiated Contact with Patient 10/15/12 1141     Chief Complaint  Patient presents with  . Rash   (Consider location/radiation/quality/duration/timing/severity/associated sxs/prior Treatment) HPI Comments: Patient presenting with a rash to the groin area, lateral lower back, abdomen around the umbilicus, and both breasts adjacent to the nipple.  Rash has been present for the past 2 weeks and is gradually worsening.  SHe has been applying an antifungal cream to the area twice a day for the past 2 weeks, but feels that there is no improvement and that the rash is worsening.  She reports that her sister had a similar rash and also her grandchild.  Denies new soaps, detergents, medications, or lotions.  She denies fever or chills.    Patient is a 42 y.o. female presenting with rash. The history is provided by the patient.  Rash Associated symptoms: no abdominal pain, no fever, no headaches, no joint pain, no myalgias, no nausea, no shortness of breath, no sore throat, no throat swelling, no tongue swelling and not vomiting     Past Medical History  Diagnosis Date  . Hypertension   . Migraine   . High cholesterol   . Scoliosis   . GERD (gastroesophageal reflux disease)   . Back pain    Past Surgical History  Procedure Laterality Date  . Abdominal hysterectomy    . Cesarean section     Family History  Problem Relation Age of Onset  . Hypertension Mother   . Stroke Mother   . Cancer Mother   . Diabetes Mother   . Heart attack Father    History  Substance Use Topics  . Smoking status: Current Every Day Smoker -- 1.00 packs/day    Types: Cigarettes  . Smokeless tobacco: Not on file  . Alcohol Use: No     Comment: social   OB History   Grav Para Term Preterm Abortions TAB SAB Ect Mult Living                 Review of Systems  Constitutional: Negative for fever and chills.  HENT: Negative for sore  throat.   Respiratory: Negative for shortness of breath.   Gastrointestinal: Negative for nausea, vomiting and abdominal pain.  Musculoskeletal: Negative for myalgias and arthralgias.  Skin: Positive for rash.  Neurological: Negative for headaches.    Allergies  Review of patient's allergies indicates no known allergies.  Home Medications   Current Outpatient Rx  Name  Route  Sig  Dispense  Refill  . Aspirin-Acetaminophen (GOODYS BODY PAIN PO)   Oral   Take 1 each by mouth daily as needed (pain).         Marland Kitchen ibuprofen (ADVIL,MOTRIN) 600 MG tablet   Oral   Take 600 mg by mouth every 8 (eight) hours as needed for pain.         Marland Kitchen lisinopril-hydrochlorothiazide (PRINZIDE,ZESTORETIC) 20-12.5 MG per tablet   Oral   Take 2 tablets by mouth daily.         . simvastatin (ZOCOR) 20 MG tablet   Oral   Take 20 mg by mouth at bedtime.          BP 108/67  Pulse 89  Temp(Src) 98 F (36.7 C) (Oral)  Resp 16  SpO2 100% Physical Exam  Nursing note and vitals reviewed. Constitutional: She appears well-developed and well-nourished.  HENT:  Head: Normocephalic and atraumatic.  Mouth/Throat: Oropharynx is clear and moist.  Neck: Normal range of motion. Neck supple.  Cardiovascular: Normal rate, regular rhythm and normal heart sounds.   Pulmonary/Chest: Effort normal and breath sounds normal.  Neurological: She is alert.  Skin: Skin is warm and dry. Rash noted.     Psychiatric: She has a normal mood and affect.    ED Course  Procedures (including critical care time) Labs Review Labs Reviewed - No data to display Imaging Review No results found.  MDM  No diagnosis found. Patient presenting with a rash that has been present for the past 2 weeks.  She has been applying antifungal cream without improvement.  Patient is afebrile.  Non toxic appearing.  Patient given Hydrocortisone cream.  Instructed to follow up with PCP if rash is not improving.    Pascal Lux McNeal,  PA-C 10/15/12 1623

## 2012-10-16 NOTE — ED Provider Notes (Signed)
Medical screening examination/treatment/procedure(s) were performed by non-physician practitioner and as supervising physician I was immediately available for consultation/collaboration.   Meagon Duskin M Greysin Medlen, DO 10/16/12 2118 

## 2013-03-12 ENCOUNTER — Encounter (HOSPITAL_COMMUNITY): Payer: Self-pay | Admitting: Emergency Medicine

## 2013-03-12 ENCOUNTER — Emergency Department (HOSPITAL_COMMUNITY): Payer: BC Managed Care – PPO

## 2013-03-12 ENCOUNTER — Emergency Department (HOSPITAL_COMMUNITY)
Admission: EM | Admit: 2013-03-12 | Discharge: 2013-03-12 | Disposition: A | Discharge status: Home / Self Care | Payer: BC Managed Care – PPO | Attending: Emergency Medicine | Admitting: Emergency Medicine

## 2013-03-12 DIAGNOSIS — E7800 Pure hypercholesterolemia, unspecified: Secondary | ICD-10-CM | POA: Insufficient documentation

## 2013-03-12 DIAGNOSIS — G43909 Migraine, unspecified, not intractable, without status migrainosus: Secondary | ICD-10-CM | POA: Insufficient documentation

## 2013-03-12 DIAGNOSIS — Z79899 Other long term (current) drug therapy: Secondary | ICD-10-CM | POA: Insufficient documentation

## 2013-03-12 DIAGNOSIS — F17200 Nicotine dependence, unspecified, uncomplicated: Secondary | ICD-10-CM | POA: Insufficient documentation

## 2013-03-12 DIAGNOSIS — J069 Acute upper respiratory infection, unspecified: Secondary | ICD-10-CM | POA: Insufficient documentation

## 2013-03-12 DIAGNOSIS — Z8719 Personal history of other diseases of the digestive system: Secondary | ICD-10-CM | POA: Insufficient documentation

## 2013-03-12 DIAGNOSIS — Z8739 Personal history of other diseases of the musculoskeletal system and connective tissue: Secondary | ICD-10-CM | POA: Insufficient documentation

## 2013-03-12 DIAGNOSIS — I10 Essential (primary) hypertension: Secondary | ICD-10-CM | POA: Insufficient documentation

## 2013-03-12 MED ORDER — HYDROCODONE-HOMATROPINE 5-1.5 MG/5ML PO SYRP: 5.0000 mL | ORAL_SOLUTION | Freq: Four times a day (QID) | ORAL | Status: DC | PRN

## 2013-03-12 NOTE — ED Notes (Signed)
Heather, PA at bedside for evaluation. 

## 2013-03-12 NOTE — Discharge Instructions (Signed)

## 2013-03-12 NOTE — ED Notes (Signed)
Pt reports headache x 2 days, and cough, sts ongoing for 2 weeks.

## 2013-03-12 NOTE — ED Provider Notes (Signed)
CSN: [id number]     Arrival date & time 03/12/13  0917 History  This chart was scribed for non-physician practitioner Hyman Bible, PA-C, working with Kathalene Frames, MD by Zettie Pho, ED Scribe. This patient was seen in room TR07C/TR07C and the patient's care was started at 9:53 AM.    Chief Complaint  Patient presents with  . Cough  . Migraine   The history is provided by the patient. No language interpreter was used.   HPI Comments: [name] is a 43 y.o. Female with a history of migraines who presents to the Emergency Department complaining of a headache to the forehead onset 3 days ago. Headache gradual in onset and has been constant.  She reports some associated congestion. She reports taking Topamax at home without significant relief. She denies sinus pain or pressure.  Denies fever, chills, neck pain, neck stiffness, nausea, vomiting, or vision changes.    Patient is also complaining of an intermittent, productive cough onset 2 weeks ago that she states is worse in the morning and has been progressively worsening. She reports taking Delsym, Mucinex Fast Max, DayQuil, and Coricidin at home without relief. She denies chest pain, shortness of breath, sore throat, myalgias, fever. Patient is a current everyday smoker, about 1 PPD. She denies history of asthma or COPD. Patient also has a history of HTN and hypercholesterolemia.   Past Medical History  Diagnosis Date  . Hypertension   . Migraine   . High cholesterol   . Scoliosis   . GERD (gastroesophageal reflux disease)   . Back pain    Past Surgical History  Procedure Laterality Date  . Abdominal hysterectomy    . Cesarean section     Family History  Problem Relation Age of Onset  . Hypertension Mother   . Stroke Mother   . Cancer Mother   . Diabetes Mother   . Heart attack Father    History  Substance Use Topics  . Smoking status: Current Every Day Smoker -- 1.00 packs/day    Types: Cigarettes  . Smokeless tobacco:  Not on file  . Alcohol Use: No     Comment: social   OB History   Grav Para Term Preterm Abortions TAB SAB Ect Mult Living                 Review of Systems  A complete 10 system review of systems was obtained and all systems are negative except as noted in the HPI and PMH.   Allergies  Review of patient's allergies indicates no known allergies.  Home Medications   Current Outpatient Rx  Name  Route  Sig  Dispense  Refill  . Aspirin-Acetaminophen (GOODYS BODY PAIN PO)   Oral   Take 1 each by mouth daily as needed (pain).         Marland Kitchen ibuprofen (ADVIL,MOTRIN) 600 MG tablet   Oral   Take 600 mg by mouth every 8 (eight) hours as needed for pain.         Marland Kitchen lisinopril-hydrochlorothiazide (PRINZIDE,ZESTORETIC) 20-12.5 MG per tablet   Oral   Take 2 tablets by mouth daily.         . permethrin (ELIMITE) 5 % cream      Apply to affected area once   60 g   0   . simvastatin (ZOCOR) 20 MG tablet   Oral   Take 20 mg by mouth at bedtime.          Triage  Vitals: BP 125/71  Pulse 86  Temp(Src) 98 F (36.7 C) (Oral)  Resp 18  Wt 173 lb (78.472 kg)  SpO2 98%  Physical Exam  Nursing note and vitals reviewed. Constitutional: She appears well-developed and well-nourished.  HENT:  Head: Normocephalic and atraumatic.  Right Ear: Hearing, tympanic membrane, external ear and ear canal normal.  Left Ear: Hearing, tympanic membrane, external ear and ear canal normal.  Mouth/Throat: Oropharynx is clear and moist.  Nasal mucosa edema. Mild frontal sinus tenderness to palpation.   Eyes: EOM are normal. Pupils are equal, round, and reactive to light.  Neck: Normal range of motion. Neck supple.  Cardiovascular: Normal rate, regular rhythm and normal heart sounds.   Pulmonary/Chest: Effort normal and breath sounds normal. No respiratory distress. She has no wheezes.  Musculoskeletal: Normal range of motion.  Neurological: She is alert.  Skin: Skin is warm and dry.   Psychiatric: She has a normal mood and affect. Her behavior is normal.    ED Course  Procedures (including critical care time)  DIAGNOSTIC STUDIES: Oxygen Saturation is 98% on room air, normal by my interpretation.    COORDINATION OF CARE: 9:58 AM- Will order a chest x-ray. Discussed that the headache is likely related to a sinus infection. Discussed treatment plan with patient at bedside and patient verbalized agreement.   10:43 AM- Discussed that x-ray results were negative. Will discharge patient with Hycodan to manage symptoms. Discussed treatment plan with patient at bedside and patient verbalized agreement.   Labs Review Labs Reviewed - No data to display  Imaging Review Dg Chest 2 View  03/12/2013   CLINICAL DATA:  Cough  EXAM: CHEST  2 VIEW  COMPARISON:  June 09, 2009  FINDINGS: Lungs are clear. Heart size and pulmonary vascularity are normal. No pneumothorax. No adenopathy. There is upper thoracic levoscoliosis with lower thoracic dextroscoliosis.  IMPRESSION: Scoliosis.  No edema or consolidation.   Electronically Signed   By: Lowella Grip M.D.   On: 03/12/2013 10:31    EKG Interpretation   None       MDM  No diagnosis found. Pt CXR negative for acute infiltrate. Patients symptoms are consistent with URI, likely viral etiology. Discussed that antibiotics are not indicated for viral infections. Pt will be discharged with symptomatic treatment.  Verbalizes understanding and is agreeable with plan. Pt is hemodynamically stable & in NAD prior to dc.  Return precautions given.  I personally performed the services described in this documentation, which was scribed in my presence. The recorded information has been reviewed and is accurate.     Hyman Bible, PA-C 03/14/13 1812

## 2013-03-18 NOTE — ED Provider Notes (Signed)
Medical screening examination/treatment/procedure(s) were performed by non-physician practitioner and as supervising physician I was immediately available for consultation/collaboration.    Tydus Sanmiguel R Jp Eastham, MD 03/18/13 0703 

## 2013-07-07 ENCOUNTER — Encounter (HOSPITAL_COMMUNITY): Payer: Self-pay | Admitting: Emergency Medicine

## 2013-07-07 ENCOUNTER — Emergency Department (HOSPITAL_COMMUNITY)
Admission: EM | Admit: 2013-07-07 | Discharge: 2013-07-07 | Disposition: A | Discharge status: Home / Self Care | Payer: BC Managed Care – PPO | Attending: Emergency Medicine | Admitting: Emergency Medicine

## 2013-07-07 DIAGNOSIS — F17200 Nicotine dependence, unspecified, uncomplicated: Secondary | ICD-10-CM | POA: Insufficient documentation

## 2013-07-07 DIAGNOSIS — G43909 Migraine, unspecified, not intractable, without status migrainosus: Secondary | ICD-10-CM

## 2013-07-07 DIAGNOSIS — Z8739 Personal history of other diseases of the musculoskeletal system and connective tissue: Secondary | ICD-10-CM | POA: Insufficient documentation

## 2013-07-07 DIAGNOSIS — I10 Essential (primary) hypertension: Secondary | ICD-10-CM | POA: Insufficient documentation

## 2013-07-07 DIAGNOSIS — Z8719 Personal history of other diseases of the digestive system: Secondary | ICD-10-CM | POA: Insufficient documentation

## 2013-07-07 DIAGNOSIS — Z79899 Other long term (current) drug therapy: Secondary | ICD-10-CM | POA: Insufficient documentation

## 2013-07-07 DIAGNOSIS — E7800 Pure hypercholesterolemia, unspecified: Secondary | ICD-10-CM | POA: Insufficient documentation

## 2013-07-07 MED ORDER — METOCLOPRAMIDE HCL 5 MG/ML IJ SOLN
10.0000 mg | Freq: Once | INTRAMUSCULAR | Status: AC
  Administered 2013-07-07: 10 mg via INTRAVENOUS
  Filled 2013-07-07: qty 2

## 2013-07-07 NOTE — ED Notes (Addendum)
She was at her doctor last week for a migraine, he started her on topamax, diclofenac, phenergan, and vicodin which she has been taking with no relief. She continues to have a migraine. She is A&Ox4, resp e/u. She said seh doesn't know what else to do so she came to ED for help

## 2013-07-07 NOTE — ED Provider Notes (Addendum)
CSN: [id number]     Arrival date & time 07/07/13  1751 History   First MD Initiated Contact with Patient 07/07/13 1005     Chief Complaint  Patient presents with  . Migraine     (Consider location/radiation/quality/duration/timing/severity/associated sxs/prior Treatment) HPI Complains of diffuse throbbing headache typical of migraines onset 7 days ago gradually.. Associated symptoms include nausea and photophobia. No fever. Patient saw her pcp Dr Jobie Quaker last week for the same complaint he treated her with "2 shots" including narcotic, without relief. Topamax dose was also increased, without relief. Symptoms are made worse by light, noise. Not improved by anything. Patient states she gets daily headaches usually managed with Goody powder gets severe headaches approximately twice per year Past Medical History  Diagnosis Date  . Hypertension   . Migraine   . High cholesterol   . Scoliosis   . GERD (gastroesophageal reflux disease)   . Back pain    Past Surgical History  Procedure Laterality Date  . Abdominal hysterectomy    . Cesarean section     Family History  Problem Relation Age of Onset  . Hypertension Mother   . Stroke Mother   . Cancer Mother   . Diabetes Mother   . Heart attack Father    History  Substance Use Topics  . Smoking status: Current Every Day Smoker -- 1.00 packs/day    Types: Cigarettes  . Smokeless tobacco: Not on file  . Alcohol Use: No     Comment: social   OB History   Grav Para Term Preterm Abortions TAB SAB Ect Mult Living                 Review of Systems  Constitutional: Negative.   HENT:       Hyperacusis  Eyes: Positive for photophobia.  Respiratory: Negative.   Cardiovascular: Negative.   Gastrointestinal: Positive for nausea.  Musculoskeletal: Negative.   Skin: Negative.   Neurological: Positive for headaches.  Psychiatric/Behavioral: Negative.   All other systems reviewed and are negative.     Allergies  Review of  patient's allergies indicates no known allergies.  Home Medications   Prior to Admission medications   Medication Sig Start Date End Date Taking? Authorizing Provider  Aspirin-Acetaminophen (GOODYS BODY PAIN PO) Take 1 packet by mouth daily as needed (pain).     Historical Provider, MD  Diphenhydramine-PE-APAP (DELSYM COUGH/COLD NIGHT TIME PO) Take 20 mLs by mouth every 4 (four) hours as needed (for cold symptoms).    Historical Provider, MD  HYDROcodone-homatropine (HYCODAN) 5-1.5 MG/5ML syrup Take 5 mLs by mouth every 6 (six) hours as needed for cough. 03/12/13   Heather Laisure, PA-C  lisinopril-hydrochlorothiazide (PRINZIDE,ZESTORETIC) 20-12.5 MG per tablet Take 2 tablets by mouth daily.    Historical Provider, MD  Phenylephrine-APAP-Guaifenesin ([place] FAST-MAX COLD & SINUS PO) Take 20 mLs by mouth every 4 (four) hours as needed (for cold symptoms).    Historical Provider, MD  simvastatin (ZOCOR) 20 MG tablet Take 20 mg by mouth at bedtime.    Historical Provider, MD   BP 117/65  Pulse 78  Temp(Src) 98.9 F (37.2 C) (Oral)  Resp 15  SpO2 100% Physical Exam  Nursing note and vitals reviewed. Constitutional: She is oriented to person, place, and time. She appears well-developed and well-nourished.  HENT:  Head: Normocephalic and atraumatic.  Eyes: Conjunctivae are normal. Pupils are equal, round, and reactive to light.  Optic discs sharp bilaterally  Neck: Neck supple. No tracheal deviation present. No  thyromegaly present.  Cardiovascular: Normal rate and regular rhythm.   No murmur heard. Pulmonary/Chest: Effort normal and breath sounds normal.  Abdominal: Soft. Bowel sounds are normal. She exhibits no distension. There is no tenderness.  Musculoskeletal: Normal range of motion. She exhibits no edema and no tenderness.  Neurological: She is alert and oriented to person, place, and time. She displays abnormal reflex. No cranial nerve deficit. She exhibits normal muscle tone.  Coordination normal.  Gait normal Romberg normal prior drift normal. DTRs symmetric bilaterally at knee jerk ankle jerk and biceps toes downward going bilaterally  Skin: Skin is warm and dry. No rash noted.  Psychiatric: She has a normal mood and affect.    ED Course  Procedures (including critical care time) Labs Review Labs Reviewed - No data to display  Imaging Review No results found.   EKG Interpretation None     10:50 AM patient feels much improved after treatment with intravenous Reglan. She is alert appropriate Glasgow Coma Score 15 MDM  Plan followup with Dr.  Jobie Quaker  as needed. Patient requests referral to a neurologist. Referred to [facility]. Diagnosis migraine headache Final diagnoses:  None        Orlie Dakin, MD 07/07/13 [place], MD 07/07/13 1105

## 2013-07-07 NOTE — Discharge Instructions (Signed)
Migraine Headache A migraine headache is an intense, throbbing pain on one or both sides of your head. A migraine can last for 30 minutes to several hours. CAUSES  The exact cause of a migraine headache is not always known. However, a migraine may be caused when nerves in the brain become irritated and release chemicals that cause inflammation. This causes pain. Certain things may also trigger migraines, such as:  Alcohol.  Smoking.  Stress.  Menstruation.  Aged cheeses.  Foods or drinks that contain nitrates, glutamate, aspartame, or tyramine.  Lack of sleep.  Chocolate.  Caffeine.  Hunger.  Physical exertion.  Fatigue.  Medicines used to treat chest pain (nitroglycerine), birth control pills, estrogen, and some blood pressure medicines. SIGNS AND SYMPTOMS  Pain on one or both sides of your head.  Pulsating or throbbing pain.  Severe pain that prevents daily activities.  Pain that is aggravated by any physical activity.  Nausea, vomiting, or both.  Dizziness.  Pain with exposure to bright lights, loud noises, or activity.  General sensitivity to bright lights, loud noises, or smells. Before you get a migraine, you may get warning signs that a migraine is coming (aura). An aura may include:  Seeing flashing lights.  Seeing bright spots, halos, or zig-zag lines.  Having tunnel vision or blurred vision.  Having feelings of numbness or tingling.  Having trouble talking.  Having muscle weakness. DIAGNOSIS  A migraine headache is often diagnosed based on:  Symptoms.  Physical exam.  A CT scan or MRI of your head. These imaging tests cannot diagnose migraines, but they can help rule out other causes of headaches. TREATMENT Medicines may be given for pain and nausea. Medicines can also be given to help prevent recurrent migraines.  HOME CARE INSTRUCTIONS  Only take over-the-counter or prescription medicines for pain or discomfort as directed by your  health care provider. The use of long-term narcotics is not recommended.  Lie down in a dark, quiet room when you have a migraine.  Keep a journal to find out what may trigger your migraine headaches. For example, write down:  What you eat and drink.  How much sleep you get.  Any change to your diet or medicines.  Limit alcohol consumption.  Quit smoking if you smoke.  Get 7 9 hours of sleep, or as recommended by your health care provider.  Limit stress.  Keep lights dim if bright lights bother you and make your migraines worse. SEEK IMMEDIATE MEDICAL CARE IF:   Your migraine becomes severe.  You have a fever.  You have a stiff neck.  You have vision loss.  You have muscular weakness or loss of muscle control.  You start losing your balance or have trouble walking.  You feel faint or pass out.  You have severe symptoms that are different from your first symptoms. MAKE SURE YOU:   Understand these instructions.  Will watch your condition.  Will get help right away if you are not doing well or get worse. Document Released: 02/06/2005 Document Revised: 11/27/2012 Document Reviewed: 10/14/2012 [employer] Patient Information 2014 [employer].  

## 2013-07-08 ENCOUNTER — Encounter: Payer: Self-pay | Admitting: *Deleted

## 2013-07-08 ENCOUNTER — Ambulatory Visit (INDEPENDENT_AMBULATORY_CARE_PROVIDER_SITE_OTHER): Payer: BC Managed Care – PPO | Admitting: Diagnostic Neuroimaging

## 2013-07-08 ENCOUNTER — Encounter: Payer: Self-pay | Admitting: Diagnostic Neuroimaging

## 2013-07-08 ENCOUNTER — Ambulatory Visit (INDEPENDENT_AMBULATORY_CARE_PROVIDER_SITE_OTHER): Payer: BC Managed Care – PPO | Admitting: *Deleted

## 2013-07-08 VITALS — BP 104/72 | HR 76 | Temp 99.2°F | Ht 63.0 in | Wt 174.5 lb

## 2013-07-08 DIAGNOSIS — Z0289 Encounter for other administrative examinations: Secondary | ICD-10-CM

## 2013-07-08 DIAGNOSIS — R42 Dizziness and giddiness: Secondary | ICD-10-CM

## 2013-07-08 DIAGNOSIS — G43109 Migraine with aura, not intractable, without status migrainosus: Secondary | ICD-10-CM

## 2013-07-08 DIAGNOSIS — T3995XA Adverse effect of unspecified nonopioid analgesic, antipyretic and antirheumatic, initial encounter: Secondary | ICD-10-CM

## 2013-07-08 DIAGNOSIS — R519 Headache, unspecified: Secondary | ICD-10-CM

## 2013-07-08 DIAGNOSIS — H539 Unspecified visual disturbance: Secondary | ICD-10-CM

## 2013-07-08 DIAGNOSIS — R51 Headache: Secondary | ICD-10-CM

## 2013-07-08 DIAGNOSIS — G4440 Drug-induced headache, not elsewhere classified, not intractable: Secondary | ICD-10-CM | POA: Insufficient documentation

## 2013-07-08 MED ORDER — VALPROATE SODIUM 500 MG/5ML IV SOLN
1000.0000 mg | INTRAVENOUS | Status: DC
  Administered 2013-07-08: 1000 mg via INTRAVENOUS

## 2013-07-08 MED ORDER — SUMATRIPTAN SUCCINATE 100 MG PO TABS: 100.0000 mg | ORAL_TABLET | Freq: Once | ORAL | Status: DC | PRN

## 2013-07-08 NOTE — Patient Instructions (Signed)
To call back as needed.   Note for work given. (out today).

## 2013-07-08 NOTE — Progress Notes (Signed)
[facility]  PATIENT: [name] DOB: [birth year]  REFERRING CLINICIAN: ER / Winfred Leeds HISTORY FROM: patient  REASON FOR VISIT: new consult   HISTORICAL  CHIEF COMPLAINT:  Chief Complaint  Patient presents with  . Migraine    HISTORY OF PRESENT ILLNESS:   43 year old right-handed female here for evaluation of headaches.  In her 45s, patient developed headaches, consisting of nausea, dizziness, photophobia and phonophobia. Global severe headaches, one to 2 times per year, lasting one day at a time.  Over the last 2 years her headaches have worsened to the point of becoming daily nagging headaches. Patient has been started on topiramate one year ago initially a 25 mg twice a day. She's been on 75 mg twice a 2 weeks ago, now on 100 twice a day for the past one week.  Patient eye exam at the beginning of the year at [facility] store.  Triggering factors include skipping meals. She does have some visual aura, seeing blackouts and spots before the headaches.  Patient has been taking these pouter a daily basis. She takes a least one packet a day, up to 6 packets per day. Patient also drinking 7-8 cups of sweet tea per day.   REVIEW OF SYSTEMS: Full 14 system review of systems performed and notable only for confusion headache dizziness sleepiness not in a sleep suicidal thoughts rash ringing in ears cough eye pain double vision.  ALLERGIES: No Known Allergies  HOME MEDICATIONS: Outpatient Prescriptions Prior to Visit  Medication Sig Dispense Refill  . lisinopril-hydrochlorothiazide (PRINZIDE,ZESTORETIC) 20-12.5 MG per tablet Take 2 tablets by mouth daily.      . promethazine (PHENERGAN) 25 MG tablet Take 25-50 mg by mouth every 8 (eight) hours as needed for nausea (migraine).      . simvastatin (ZOCOR) 20 MG tablet Take 20 mg by mouth at bedtime.      . topiramate (TOPAMAX) 100 MG tablet Take 100 mg by mouth 2 (two) times daily.      . diclofenac  (VOLTAREN) 50 MG EC tablet Take 50 mg by mouth 2 (two) times daily as needed (pain).      Marland Kitchen HYDROcodone-acetaminophen (NORCO) 7.5-325 MG per tablet Take 1-2 tablets by mouth every 8 (eight) hours as needed for severe pain (migraine).       No facility-administered medications prior to visit.    PAST MEDICAL HISTORY: Past Medical History  Diagnosis Date  . Hypertension   . Migraine   . High cholesterol   . Scoliosis   . GERD (gastroesophageal reflux disease)   . Back pain     PAST SURGICAL HISTORY: Past Surgical History  Procedure Laterality Date  . Abdominal hysterectomy    . Cesarean section      FAMILY HISTORY: Family History  Problem Relation Age of Onset  . Hypertension Mother   . Stroke Mother   . Cancer Mother   . Diabetes Mother   . Heart attack Father     SOCIAL HISTORY:  History   Social History  . Marital Status: Married    Spouse Name: Lucious    Number of Children: 2  . Years of Education: 12th   Occupational History  . [place] History Main Topics  . Smoking status: Current Every Day Smoker -- 1.00 packs/day for 15 years    Types: Cigarettes  . Smokeless tobacco: Never Used  . Alcohol Use: Yes     Comment: social  . Drug  Use: No  . Sexual Activity: Not on file   Other Topics Concern  . Not on file   Social History Narrative   Patient lives at home with her family.   Caffeine use:7-8 cups daily (tea)     PHYSICAL EXAM  Filed Vitals:   07/08/13 0829  BP: 104/72  Pulse: 76  Temp: 99.2 F (37.3 C)  TempSrc: Oral  Height: 5\' 3"  (1.6 m)  Weight: 174 lb 8 oz (79.153 kg)    Not recorded    Body mass index is 30.92 kg/(m^2).  GENERAL EXAM: Patient is in no distress; well developed, nourished and groomed; neck is supple  CARDIOVASCULAR: Regular rate and rhythm, no murmurs, no carotid bruits  NEUROLOGIC: MENTAL STATUS: awake, alert, oriented to person, place and time, recent and remote memory intact,  normal attention and concentration, language fluent, comprehension intact, naming intact, fund of knowledge appropriate CRANIAL NERVE: no papilledema on fundoscopic exam, pupils equal and reactive to light, visual fields full to confrontation, extraocular muscles intact, no nystagmus, facial sensation and strength symmetric, hearing intact, palate elevates symmetrically, uvula midline, shoulder shrug symmetric, tongue midline. MOTOR: normal bulk and tone, full strength in the BUE, BLE SENSORY: normal and symmetric to light touch, pinprick, temperature, vibration  COORDINATION: finger-nose-finger, fine finger movements normal REFLEXES: deep tendon reflexes present and symmetric GAIT/STATION: narrow based gait; able to walk on toes, heels and tandem; romberg is negative    DIAGNOSTIC DATA (LABS, IMAGING, TESTING) - I reviewed patient records, labs, notes, testing and imaging myself where available.  Lab Results  Component Value Date   WBC 5.5 09/25/2012   HGB 15.0 09/25/2012   HCT 42.3 09/25/2012   MCV 89.8 09/25/2012   PLT 290 09/25/2012      Component Value Date/Time   NA 139 09/25/2012 1250   K 3.5 09/25/2012 1250   CL 103 09/25/2012 1250   CO2 27 09/25/2012 1250   GLUCOSE 109* 09/25/2012 1250   BUN 9 09/25/2012 1250   CREATININE 0.66 09/25/2012 1250   CALCIUM 9.8 09/25/2012 1250   PROT 7.3 09/25/2012 1250   ALBUMIN 3.8 09/25/2012 1250   AST 25 09/25/2012 1250   ALT 24 09/25/2012 1250   ALKPHOS 85 09/25/2012 1250   BILITOT 0.4 09/25/2012 1250   GFRNONAA >90 09/25/2012 1250   GFRAA >90 09/25/2012 1250   Lab Results  Component Value Date   CHOL 234* 11/18/2009   HDL 37* 11/18/2009   LDLCALC 175* 11/18/2009   TRIG 111 11/18/2009   CHOLHDL 6.3 Ratio 11/18/2009   Lab Results  Component Value Date   HGBA1C 6.1* 11/18/2009   No results found for this basename: [id number]   Lab Results  Component Value Date   TSH 0.890 10/04/2009    ASSESSMENT AND PLAN  43 y.o. year old female here with headaches (likely  migraine) since age 43's, but worse HA in last few years. Now on daily goody's powder (up to 6 per day). Also with excessive caffeine use (7-8 glasses sweet tea per day).   Ddx: migraine with aura + analgesic overuse headache + chronic daily headache; vs secondary HA  PLAN:  Depacon infusion 1000mg  IV today  Gradually taper off of Goody's powders over next 1 month.  Gradually reduce caffeine intake over next 1 month. Try to drink no more than 1-2 caffeine drinks per day. Do not drink caffeine after lunch time. Transition to water.  Do not skip meals.  Stop diclofenac.  Stop hydrocodone.  Continue topiramate  100mg  twice a day.  Try sumatriptan as needed for breakthrough migraine attacks.  MRI brain (to rule out secondary causes)  Ophthal eval (to eval for papilledema)   Orders Placed This Encounter  Procedures  . MR Brain Wo Contrast  . Ambulatory referral to Ophthalmology    Meds ordered this encounter  Medications  . SUMAtriptan (IMITREX) 100 MG tablet    Sig: Take 1 tablet (100 mg total) by mouth once as needed for migraine. May repeat x 1 after 2 hours; maximum 2 tabs per day and 8 tabs per month    Dispense:  8 tablet    Refill:  6    Return in about 3 months (around 10/08/2013) for with Charlott Holler or Penumalli.    Penni Bombard, MD 2/94/7654, 6:50 AM Certified in Neurology, Neurophysiology and Neuroimaging  [facility] [address] [place], [place] [address] [phone]

## 2013-07-08 NOTE — Patient Instructions (Signed)
Gradually taper off of Goody's powders over next 1 month.  Gradually reduce caffeine intake over next 1 month. Try to drink no more than 1-2 caffeine drinks per day. Do not drink caffeine after lunch time. Transition to water.  Do not skip meals.  Stop diclofenac.  Stop hydrocodone.  Continue topiramate 100mg  twice a day.  Try sumatriptan as needed for breakthrough migraine attacks.  I will check MRI brain.   I will refer you to eye specialist for detailed eye exam.

## 2013-07-08 NOTE — Progress Notes (Signed)
Pt here for appointment.   Order per Dr. Leta Baptist for a depacon infusion 1000mg  IV.  Pt taken to treatment room.  Made comfortable in recliner.   Instructed on plan.  Under aseptic technique 24g angiocath inserted to R hand with good blood return, taped securely.   R arm made comfortable on pillow and covered due to coolness from IVF.  Depacon 1000mg  / NS 0.9% 161ml started infusion at 0937.  Level 8, now, 10 when came into the office.  Pain temple to temple.  Has had for 8 days.  Lights and noise bother her, nausea is an associated sx.  At 0953 headache level to 6-7.  NS flush started.  1001 IV discontinued.  Pressure applied, bandage applied.  VS Bp 121/85, pulse 71.  Pt accompanied to check out.  I assisted in filling out her MRI form.

## 2013-09-14 ENCOUNTER — Emergency Department (HOSPITAL_COMMUNITY): Payer: BC Managed Care – PPO

## 2013-09-14 ENCOUNTER — Emergency Department (HOSPITAL_COMMUNITY)
Admission: EM | Admit: 2013-09-14 | Discharge: 2013-09-14 | Disposition: A | Discharge status: Home / Self Care | Payer: BC Managed Care – PPO | Attending: Emergency Medicine | Admitting: Emergency Medicine

## 2013-09-14 ENCOUNTER — Encounter (HOSPITAL_COMMUNITY): Payer: Self-pay | Admitting: Emergency Medicine

## 2013-09-14 DIAGNOSIS — G43909 Migraine, unspecified, not intractable, without status migrainosus: Secondary | ICD-10-CM | POA: Insufficient documentation

## 2013-09-14 DIAGNOSIS — R110 Nausea: Secondary | ICD-10-CM | POA: Insufficient documentation

## 2013-09-14 DIAGNOSIS — Z8739 Personal history of other diseases of the musculoskeletal system and connective tissue: Secondary | ICD-10-CM | POA: Insufficient documentation

## 2013-09-14 DIAGNOSIS — Z79899 Other long term (current) drug therapy: Secondary | ICD-10-CM | POA: Insufficient documentation

## 2013-09-14 DIAGNOSIS — R1011 Right upper quadrant pain: Secondary | ICD-10-CM | POA: Insufficient documentation

## 2013-09-14 DIAGNOSIS — E7800 Pure hypercholesterolemia, unspecified: Secondary | ICD-10-CM | POA: Insufficient documentation

## 2013-09-14 DIAGNOSIS — I10 Essential (primary) hypertension: Secondary | ICD-10-CM | POA: Insufficient documentation

## 2013-09-14 DIAGNOSIS — Z90710 Acquired absence of both cervix and uterus: Secondary | ICD-10-CM | POA: Insufficient documentation

## 2013-09-14 DIAGNOSIS — R109 Unspecified abdominal pain: Secondary | ICD-10-CM | POA: Insufficient documentation

## 2013-09-14 DIAGNOSIS — Z9889 Other specified postprocedural states: Secondary | ICD-10-CM | POA: Insufficient documentation

## 2013-09-14 DIAGNOSIS — K219 Gastro-esophageal reflux disease without esophagitis: Secondary | ICD-10-CM | POA: Insufficient documentation

## 2013-09-14 DIAGNOSIS — F17200 Nicotine dependence, unspecified, uncomplicated: Secondary | ICD-10-CM | POA: Insufficient documentation

## 2013-09-14 LAB — URINALYSIS, ROUTINE W REFLEX MICROSCOPIC
Bilirubin Urine: NEGATIVE
Glucose, UA: NEGATIVE mg/dL
Hgb urine dipstick: NEGATIVE
Ketones, ur: NEGATIVE mg/dL
LEUKOCYTES UA: NEGATIVE
Nitrite: NEGATIVE
PH: 5 (ref 5.0–8.0)
PROTEIN: NEGATIVE mg/dL
Specific Gravity, Urine: 1.017 (ref 1.005–1.030)
Urobilinogen, UA: 1 mg/dL (ref 0.0–1.0)

## 2013-09-14 LAB — COMPREHENSIVE METABOLIC PANEL
ALBUMIN: 4.1 g/dL (ref 3.5–5.2)
ALT: 10 U/L (ref 0–35)
AST: 15 U/L (ref 0–37)
Alkaline Phosphatase: 69 U/L (ref 39–117)
Anion gap: 15 (ref 5–15)
BUN: 17 mg/dL (ref 6–23)
CALCIUM: 9 mg/dL (ref 8.4–10.5)
CO2: 22 mEq/L (ref 19–32)
Chloride: 105 mEq/L (ref 96–112)
Creatinine, Ser: 0.69 mg/dL (ref 0.50–1.10)
GFR calc Af Amer: >90 mL/min (ref >90)
GFR calc non Af Amer: >90 mL/min (ref >90)
Glucose, Bld: 94 mg/dL (ref 70–99)
Potassium: 3.3 mEq/L — ABNORMAL LOW (ref 3.7–5.3)
SODIUM: 142 meq/L (ref 137–147)
TOTAL PROTEIN: 7 g/dL (ref 6.0–8.3)
Total Bilirubin: 0.3 mg/dL (ref 0.3–1.2)

## 2013-09-14 LAB — CBC WITH DIFFERENTIAL/PLATELET
BASOS ABS: 0 10*3/uL (ref 0.0–0.1)
Basophils Relative: 1 % (ref 0–1)
EOS ABS: 0.2 10*3/uL (ref 0.0–0.7)
Eosinophils Relative: 3 % (ref 0–5)
HEMATOCRIT: 35.7 % — AB (ref 36.0–46.0)
HEMOGLOBIN: 12.2 g/dL (ref 12.0–15.0)
Lymphocytes Relative: 51 % — ABNORMAL HIGH (ref 12–46)
Lymphs Abs: 3.3 10*3/uL (ref 0.7–4.0)
MCH: 30.8 pg (ref 26.0–34.0)
MCHC: 34.2 g/dL (ref 30.0–36.0)
MCV: 90.2 fL (ref 78.0–100.0)
Monocytes Absolute: 0.4 10*3/uL (ref 0.1–1.0)
Monocytes Relative: 6 % (ref 3–12)
Neutro Abs: 2.5 10*3/uL (ref 1.7–7.7)
Neutrophils Relative %: 39 % — ABNORMAL LOW (ref 43–77)
PLATELETS: 259 10*3/uL (ref 150–400)
RBC: 3.96 MIL/uL (ref 3.87–5.11)
RDW: 13.4 % (ref 11.5–15.5)
WBC: 6.3 10*3/uL (ref 4.0–10.5)

## 2013-09-14 LAB — I-STAT CG4 LACTIC ACID, ED: LACTIC ACID, VENOUS: 0.64 mmol/L (ref 0.5–2.2)

## 2013-09-14 LAB — LIPASE, BLOOD: Lipase: 38 U/L (ref 11–59)

## 2013-09-14 MED ORDER — IOHEXOL 300 MG/ML  SOLN: 25.0000 mL | Freq: Once | INTRAMUSCULAR | Status: DC | PRN

## 2013-09-14 MED ORDER — GI COCKTAIL ~~LOC~~
30.0000 mL | Freq: Once | ORAL | Status: AC
  Administered 2013-09-14: 30 mL via ORAL
  Filled 2013-09-14: qty 30

## 2013-09-14 MED ORDER — IOHEXOL 300 MG/ML  SOLN
100.0000 mL | Freq: Once | INTRAMUSCULAR | Status: AC | PRN
  Administered 2013-09-14: 100 mL via INTRAVENOUS

## 2013-09-14 MED ORDER — SODIUM CHLORIDE 0.9 % IV BOLUS (SEPSIS)
1000.0000 mL | Freq: Once | INTRAVENOUS | Status: AC
  Administered 2013-09-14: 1000 mL via INTRAVENOUS

## 2013-09-14 MED ORDER — FAMOTIDINE 20 MG PO TABS: 20.0000 mg | ORAL_TABLET | Freq: Two times a day (BID) | ORAL | Status: DC

## 2013-09-14 MED ORDER — MORPHINE SULFATE 4 MG/ML IJ SOLN
4.0000 mg | Freq: Once | INTRAMUSCULAR | Status: AC
  Administered 2013-09-14: 4 mg via INTRAVENOUS
  Filled 2013-09-14: qty 1

## 2013-09-14 MED ORDER — ONDANSETRON 4 MG PO TBDP: 4.0000 mg | ORAL_TABLET | Freq: Three times a day (TID) | ORAL | Status: DC | PRN

## 2013-09-14 MED ORDER — ONDANSETRON HCL 4 MG/2ML IJ SOLN
4.0000 mg | Freq: Once | INTRAMUSCULAR | Status: AC
  Administered 2013-09-14: 4 mg via INTRAVENOUS
  Filled 2013-09-14: qty 2

## 2013-09-14 NOTE — Discharge Instructions (Signed)
Please follow up with your primary care physician in 1-2 days. If you do not have one please call the [facility] number listed above. Please follow up with the gastrointestinal office to schedule a follow up appointment.  Please take medications as prescribed. Please read all discharge instructions and return precautions.    Abdominal Pain, Women Abdominal (stomach, pelvic, or belly) pain can be caused by many things. It is important to tell your doctor:  The location of the pain.  Does it come and go or is it present all the time?  Are there things that start the pain (eating certain foods, exercise)?  Are there other symptoms associated with the pain (fever, nausea, vomiting, diarrhea)? All of this is helpful to know when trying to find the cause of the pain. CAUSES   Stomach: virus or bacteria infection, or ulcer.  Intestine: appendicitis (inflamed appendix), regional ileitis (Crohn's disease), ulcerative colitis (inflamed colon), irritable bowel syndrome, diverticulitis (inflamed diverticulum of the colon), or cancer of the stomach or intestine.  Gallbladder disease or stones in the gallbladder.  Kidney disease, kidney stones, or infection.  Pancreas infection or cancer.  Fibromyalgia (pain disorder).  Diseases of the female organs:  Uterus: fibroid (non-cancerous) tumors or infection.  Fallopian tubes: infection or tubal pregnancy.  Ovary: cysts or tumors.  Pelvic adhesions (scar tissue).  Endometriosis (uterus lining tissue growing in the pelvis and on the pelvic organs).  Pelvic congestion syndrome (female organs filling up with blood just before the menstrual period).  Pain with the menstrual period.  Pain with ovulation (producing an egg).  Pain with an IUD (intrauterine device, birth control) in the uterus.  Cancer of the female organs.  Functional pain (pain not caused by a disease, may improve without treatment).  Psychological  pain.  Depression. DIAGNOSIS  Your doctor will decide the seriousness of your pain by doing an examination.  Blood tests.  X-rays.  Ultrasound.  CT scan (computed tomography, special type of X-ray).  MRI (magnetic resonance imaging).  Cultures, for infection.  Barium enema (dye inserted in the large intestine, to better view it with X-rays).  Colonoscopy (looking in intestine with a lighted tube).  Laparoscopy (minor surgery, looking in abdomen with a lighted tube).  Major abdominal exploratory surgery (looking in abdomen with a large incision). TREATMENT  The treatment will depend on the cause of the pain.   Many cases can be observed and treated at home.  Over-the-counter medicines recommended by your caregiver.  Prescription medicine.  Antibiotics, for infection.  Birth control pills, for painful periods or for ovulation pain.  Hormone treatment, for endometriosis.  Nerve blocking injections.  Physical therapy.  Antidepressants.  Counseling with a psychologist or psychiatrist.  Minor or major surgery. HOME CARE INSTRUCTIONS   Do not take laxatives, unless directed by your caregiver.  Take over-the-counter pain medicine only if ordered by your caregiver. Do not take aspirin because it can cause an upset stomach or bleeding.  Try a clear liquid diet (broth or water) as ordered by your caregiver. Slowly move to a bland diet, as tolerated, if the pain is related to the stomach or intestine.  Have a thermometer and take your temperature several times a day, and record it.  Bed rest and sleep, if it helps the pain.  Avoid sexual intercourse, if it causes pain.  Avoid stressful situations.  Keep your follow-up appointments and tests, as your caregiver orders.  If the pain does not go away with  medicine or surgery, you may try:  Acupuncture.  Relaxation exercises (yoga, meditation).  Group therapy.  Counseling. SEEK MEDICAL CARE IF:   You  notice certain foods cause stomach pain.  Your home care treatment is not helping your pain.  You need stronger pain medicine.  You want your IUD removed.  You feel faint or lightheaded.  You develop nausea and vomiting.  You develop a rash.  You are having side effects or an allergy to your medicine. SEEK IMMEDIATE MEDICAL CARE IF:   Your pain does not go away or gets worse.  You have a fever.  Your pain is felt only in portions of the abdomen. The right side could possibly be appendicitis. The left lower portion of the abdomen could be colitis or diverticulitis.  You are passing blood in your stools (bright red or black tarry stools, with or without vomiting).  You have blood in your urine.  You develop chills, with or without a fever.  You pass out. MAKE SURE YOU:   Understand these instructions.  Will watch your condition.  Will get help right away if you are not doing well or get worse. Document Released: 12/04/2006 Document Revised: 06/23/2013 Document Reviewed: 12/24/2008 [facility] Patient Information 2015 [place], [place]. This information is not intended to replace advice given to you by your health care provider. Make sure you discuss any questions you have with your health care provider.

## 2013-09-14 NOTE — ED Provider Notes (Signed)
CSN: [id number]     Arrival date & time 09/14/13  3570 History   First MD Initiated Contact with Patient 09/14/13 [phone]     Chief Complaint  Patient presents with  . Abdominal Pain     (Consider location/radiation/quality/duration/timing/severity/associated sxs/prior Treatment) HPI Comments: Patient is a 43 year old female past medical history significant for hypertension, migraine, hyperlipidemia, GERD presented to the emergency department for 2 months of mid abdominal pain with associated nausea. Describes as constant pain unable to qualify that of pain. Alleviating factors. Patient states eating and drinking exacerbate her pain. Denies any fevers, chills, emesis, diarrhea, constipation, vaginal bleeding or discharge, urinary symptoms, back pain. Abdominal surgical history includes hysterectomy and cesarean section.  Patient is a 43 y.o. female presenting with abdominal pain.  Abdominal Pain Associated symptoms: nausea   Associated symptoms: no chills, no constipation, no diarrhea, no fever and no vomiting     Past Medical History  Diagnosis Date  . Hypertension   . Migraine   . High cholesterol   . Scoliosis   . GERD (gastroesophageal reflux disease)   . Back pain    Past Surgical History  Procedure Laterality Date  . Abdominal hysterectomy    . Cesarean section     Family History  Problem Relation Age of Onset  . Hypertension Mother   . Stroke Mother   . Cancer Mother   . Diabetes Mother   . Heart attack Father    History  Substance Use Topics  . Smoking status: Current Every Day Smoker -- 1.00 packs/day for 15 years    Types: Cigarettes  . Smokeless tobacco: Never Used  . Alcohol Use: Yes     Comment: social   OB History   Grav Para Term Preterm Abortions TAB SAB Ect Mult Living                 Review of Systems  Constitutional: Negative for fever and chills.  Gastrointestinal: Positive for nausea and abdominal pain. Negative for vomiting, diarrhea,  constipation, blood in stool and anal bleeding.  All other systems reviewed and are negative.     Allergies  Review of patient's allergies indicates no known allergies.  Home Medications   Prior to Admission medications   Medication Sig Start Date End Date Taking? Authorizing Provider  Aspirin-Acetaminophen-Caffeine (EXCEDRIN MIGRAINE PO) Take 2 tablets by mouth daily as needed (migraine).   Yes Historical Provider, MD  lisinopril-hydrochlorothiazide (PRINZIDE,ZESTORETIC) 20-12.5 MG per tablet Take 2 tablets by mouth daily.   Yes Historical Provider, MD  potassium chloride (MICRO-K) 10 MEQ CR capsule Take 10 mEq by mouth daily. 08/14/13  Yes Historical Provider, MD  simvastatin (ZOCOR) 20 MG tablet Take 20 mg by mouth at bedtime.   Yes Historical Provider, MD  SUMAtriptan (IMITREX) 100 MG tablet Take 1 tablet (100 mg total) by mouth once as needed for migraine. May repeat x 1 after 2 hours; maximum 2 tabs per day and 8 tabs per month 07/08/13  Yes Vikram R Penumalli, MD  topiramate (TOPAMAX) 100 MG tablet Take 100 mg by mouth 2 (two) times daily.   Yes Historical Provider, MD  famotidine (PEPCID) 20 MG tablet Take 1 tablet (20 mg total) by mouth 2 (two) times daily. 09/14/13   Delante Karapetyan L Alexea Blase, PA-C  ondansetron (ZOFRAN ODT) 4 MG disintegrating tablet Take 1 tablet (4 mg total) by mouth every 8 (eight) hours as needed for nausea or vomiting. 09/14/13   Stephani Police Kinta Martis, PA-C   BP [phone]  Pulse 60  Temp(Src) 98 F (36.7 C) (Oral)  Resp 18  Ht 5\' 5"  (1.651 m)  Wt 167 lb (75.751 kg)  BMI 27.79 kg/m2  SpO2 100% Physical Exam  Nursing note and vitals reviewed. Constitutional: She is oriented to person, place, and time. She appears well-developed and well-nourished. No distress.  HENT:  Head: Normocephalic and atraumatic.  Right Ear: External ear normal.  Left Ear: External ear normal.  Nose: Nose normal.  Mouth/Throat: Oropharynx is clear and moist. No oropharyngeal exudate.   Eyes: Conjunctivae are normal.  Neck: Neck supple.  Cardiovascular: Normal rate, regular rhythm and normal heart sounds.   Pulmonary/Chest: Effort normal and breath sounds normal.  Abdominal: Soft. Normal appearance and bowel sounds are normal. She exhibits no distension. There is tenderness in the right upper quadrant. There is no rigidity, no rebound and no guarding.  Musculoskeletal:  MAE x 4  Neurological: She is alert and oriented to person, place, and time.  Skin: Skin is warm and dry. She is not diaphoretic.    ED Course  Procedures (including critical care time) Medications  iohexol (OMNIPAQUE) 300 MG/ML solution 25 mL (not administered)  sodium chloride 0.9 % bolus 1,000 mL (0 mLs Intravenous Stopped 09/14/13 1137)  ondansetron (ZOFRAN) injection 4 mg (4 mg Intravenous Given 09/14/13 0945)  morphine 4 MG/ML injection 4 mg (4 mg Intravenous Given 09/14/13 0945)  gi cocktail (Maalox,Lidocaine,Donnatal) (30 mLs Oral Given 09/14/13 1139)  sodium chloride 0.9 % bolus 1,000 mL (0 mLs Intravenous Stopped 09/14/13 1251)  iohexol (OMNIPAQUE) 300 MG/ML solution 100 mL (100 mLs Intravenous Contrast Given 09/14/13 1341)    Labs Review Labs Reviewed  CBC WITH DIFFERENTIAL - Abnormal; Notable for the following:    HCT 35.7 (*)    Neutrophils Relative % 39 (*)    Lymphocytes Relative 51 (*)    All other components within normal limits  COMPREHENSIVE METABOLIC PANEL - Abnormal; Notable for the following:    Potassium 3.3 (*)    All other components within normal limits  LIPASE, BLOOD  URINALYSIS, ROUTINE W REFLEX MICROSCOPIC  I-STAT CG4 LACTIC ACID, ED    Imaging Review US Abdomen Complete  09/14/2013   CLINICAL DATA:  Abdominal pain  EXAM: ULTRASOUND ABDOMEN COMPLETE  COMPARISON:  None.  FINDINGS: Gallbladder:  No gallstones or wall thickening visualized. There is no pericholecystic fluid. No sonographic Murphy sign noted.  Common bile duct:  Diameter: 3 mm. There is no intrahepatic,  common hepatic, or common bile duct dilatation.  Liver:  No focal lesion identified. Within normal limits in parenchymal echogenicity.  IVC:  No abnormality visualized.  Pancreas:  No mass or inflammatory focus. Pancreatic duct is upper normal in size at 2 mm diameter.  Spleen:  Size and appearance within normal limits.  Right Kidney:  Length: 11.1 cm. Echogenicity within normal limits. No mass or hydronephrosis visualized.  Left Kidney:  Length: 11.0 cm. Echogenicity within normal limits. No mass or hydronephrosis visualized.  Abdominal aorta:  No aneurysm visualized.  Other findings:  No demonstrable ascites.  IMPRESSION: Pancreatic duct is upper normal in size. Pancreas otherwise appears normal. Study otherwise within normal limits.   Electronically Signed   By: Lowella Grip M.D.   On: 09/14/2013 11:00   Ct Abdomen Pelvis W Contrast  09/14/2013   CLINICAL DATA:  Mid abdominal pain for the past 2 months. Loss of appetite.  EXAM: CT ABDOMEN AND PELVIS WITH CONTRAST  TECHNIQUE: Multidetector CT imaging of the abdomen and pelvis  was performed using the standard protocol following bolus administration of intravenous contrast.  CONTRAST:  127mL OMNIPAQUE IOHEXOL 300 MG/ML  SOLN  COMPARISON:  No priors.  FINDINGS: Lung Bases: Unremarkable.  Abdomen/Pelvis: The appearance of the liver, gallbladder, pancreas, spleen, bilateral adrenal glands and the left kidney is unremarkable. Sub cm low-attenuation lesion in the interpolar region of the right kidney is too small to characterize, but favored to represent a tiny cyst.  Status post hysterectomy. Ovaries are not confidently identified may be surgically absent or atrophic. No significant volume of ascites. No pneumoperitoneum. No pathologic distention of small bowel. A few scattered colonic diverticulae are noted, without surrounding inflammatory changes to suggest an acute diverticulitis at this time. Normal appendix. Urinary bladder is unremarkable in appearance.   Musculoskeletal: There are no aggressive appearing lytic or blastic lesions noted in the visualized portions of the skeleton.  IMPRESSION: 1. No acute findings in the abdomen or pelvis to account for the patient's symptoms. 2. Mild colonic diverticulosis without findings to suggest acute diverticulitis at this time. 3. Normal appendix.   Electronically Signed   By: Vinnie Langton M.D.   On: 09/14/2013 14:10     EKG Interpretation None      MDM   Final diagnoses:  Abdominal pain in female patient    Filed Vitals:   09/14/13 1413  BP: 94/63  Pulse: 60  Temp:   Resp:     Afebrile, NAD, non-toxic appearing, AAOx4.  Patient is nontoxic, nonseptic appearing, in no apparent distress.  Patient's pain and other symptoms adequately managed in emergency department.  Fluid bolus given.  Labs, imaging and vitals reviewed.  Patient does not meet the SIRS or Sepsis criteria.  On repeat exam patient does not have a surgical abdomen and there are nor peritoneal signs.  No indication of appendicitis, bowel obstruction, bowel perforation, cholecystitis, diverticulitis. BP is noted to be low, IVF given. Patient is able to ambulate, asymptomatic.  Patient discharged home with symptomatic treatment and given strict instructions for follow-up with their primary care physician.  I have also discussed reasons to return immediately to the ER.  Patient expresses understanding and agrees with plan. Patient is stable at time of discharge       Harlow Mares, PA-C 09/14/13 1518

## 2013-09-14 NOTE — ED Notes (Signed)
Pt reports constant mid abd pains x 2 month, having lack of appetite but denies n/v/d.

## 2013-09-14 NOTE — ED Notes (Signed)
Patient transported to CT 

## 2013-09-14 NOTE — ED Provider Notes (Signed)
Medical screening examination/treatment/procedure(s) were performed by non-physician practitioner and as supervising physician I was immediately available for consultation/collaboration.   EKG Interpretation None        Ezequiel Essex, MD 09/14/13 1728

## 2013-10-08 ENCOUNTER — Ambulatory Visit: Payer: BC Managed Care – PPO | Admitting: Diagnostic Neuroimaging

## 2014-10-24 ENCOUNTER — Encounter (HOSPITAL_COMMUNITY): Payer: Self-pay | Admitting: Nurse Practitioner

## 2014-10-24 ENCOUNTER — Emergency Department (HOSPITAL_COMMUNITY)
Admission: EM | Admit: 2014-10-24 | Discharge: 2014-10-24 | Discharge status: Against Medical Advice | Payer: BLUE CROSS/BLUE SHIELD | Attending: Emergency Medicine | Admitting: Emergency Medicine

## 2014-10-24 DIAGNOSIS — I10 Essential (primary) hypertension: Secondary | ICD-10-CM | POA: Insufficient documentation

## 2014-10-24 DIAGNOSIS — R1084 Generalized abdominal pain: Secondary | ICD-10-CM | POA: Diagnosis not present

## 2014-10-24 DIAGNOSIS — Z720 Tobacco use: Secondary | ICD-10-CM | POA: Diagnosis not present

## 2014-10-24 LAB — COMPREHENSIVE METABOLIC PANEL
ALBUMIN: 3.9 g/dL (ref 3.5–5.0)
ALK PHOS: 60 U/L (ref 38–126)
ALT: 18 U/L (ref 14–54)
AST: 23 U/L (ref 15–41)
Anion gap: 11 (ref 5–15)
BUN: 17 mg/dL (ref 6–20)
CALCIUM: 9.4 mg/dL (ref 8.9–10.3)
CO2: 21 mmol/L — AB (ref 22–32)
CREATININE: 0.7 mg/dL (ref 0.44–1.00)
Chloride: 105 mmol/L (ref 101–111)
GFR calc Af Amer: >60 mL/min (ref >60)
GFR calc non Af Amer: >60 mL/min (ref >60)
GLUCOSE: 88 mg/dL (ref 65–99)
Potassium: 3.5 mmol/L (ref 3.5–5.1)
SODIUM: 137 mmol/L (ref 135–145)
Total Bilirubin: 0.6 mg/dL (ref 0.3–1.2)
Total Protein: 6.7 g/dL (ref 6.5–8.1)

## 2014-10-24 LAB — URINALYSIS, ROUTINE W REFLEX MICROSCOPIC
Bilirubin Urine: NEGATIVE
GLUCOSE, UA: NEGATIVE mg/dL
Hgb urine dipstick: NEGATIVE
Ketones, ur: NEGATIVE mg/dL
Leukocytes, UA: NEGATIVE
Nitrite: NEGATIVE
PROTEIN: NEGATIVE mg/dL
Specific Gravity, Urine: 1.021 (ref 1.005–1.030)
Urobilinogen, UA: 2 mg/dL — ABNORMAL HIGH (ref 0.0–1.0)
pH: 6.5 (ref 5.0–8.0)

## 2014-10-24 LAB — CBC
HCT: 41 % (ref 36.0–46.0)
HEMOGLOBIN: 13.6 g/dL (ref 12.0–15.0)
MCH: 30.6 pg (ref 26.0–34.0)
MCHC: 33.2 g/dL (ref 30.0–36.0)
MCV: 92.3 fL (ref 78.0–100.0)
PLATELETS: 284 10*3/uL (ref 150–400)
RBC: 4.44 MIL/uL (ref 3.87–5.11)
RDW: 13.8 % (ref 11.5–15.5)
WBC: 6.1 10*3/uL (ref 4.0–10.5)

## 2014-10-24 NOTE — ED Notes (Signed)
Pt c/o generalized abd pain and vomiting x 3 days. She denies bowel, bladder changes. She has noticed rectal pain when sitting down. She just started a new job and is wondering if she "pulled something." She is A&Ox4, resp e/u.

## 2014-10-24 NOTE — ED Notes (Signed)
Called pt for room placement x 2 - no answer.

## 2014-10-28 ENCOUNTER — Ambulatory Visit
Admission: RE | Admit: 2014-10-28 | Discharge: 2014-10-28 | Disposition: A | Discharge status: Home / Self Care | Payer: BLUE CROSS/BLUE SHIELD | Source: Ambulatory Visit | Attending: Internal Medicine | Admitting: Internal Medicine

## 2014-10-28 ENCOUNTER — Other Ambulatory Visit: Payer: Self-pay | Admitting: Internal Medicine

## 2014-10-28 DIAGNOSIS — R1032 Left lower quadrant pain: Secondary | ICD-10-CM

## 2014-10-28 MED ORDER — IOPAMIDOL (ISOVUE-300) INJECTION 61%
100.0000 mL | Freq: Once | INTRAVENOUS | Status: AC | PRN
  Administered 2014-10-28: 100 mL via INTRAVENOUS

## 2014-10-29 ENCOUNTER — Other Ambulatory Visit: Payer: Self-pay | Admitting: Internal Medicine

## 2014-10-29 DIAGNOSIS — R1032 Left lower quadrant pain: Secondary | ICD-10-CM

## 2014-11-26 ENCOUNTER — Emergency Department (HOSPITAL_COMMUNITY)
Admission: EM | Admit: 2014-11-26 | Discharge: 2014-11-26 | Disposition: A | Discharge status: Home / Self Care | Payer: BLUE CROSS/BLUE SHIELD | Attending: Emergency Medicine | Admitting: Emergency Medicine

## 2014-11-26 ENCOUNTER — Encounter (HOSPITAL_COMMUNITY): Payer: Self-pay | Admitting: Emergency Medicine

## 2014-11-26 ENCOUNTER — Emergency Department (HOSPITAL_COMMUNITY): Payer: BLUE CROSS/BLUE SHIELD

## 2014-11-26 DIAGNOSIS — R079 Chest pain, unspecified: Secondary | ICD-10-CM | POA: Diagnosis present

## 2014-11-26 DIAGNOSIS — M419 Scoliosis, unspecified: Secondary | ICD-10-CM | POA: Insufficient documentation

## 2014-11-26 DIAGNOSIS — I10 Essential (primary) hypertension: Secondary | ICD-10-CM | POA: Insufficient documentation

## 2014-11-26 DIAGNOSIS — I160 Hypertensive urgency: Secondary | ICD-10-CM | POA: Diagnosis not present

## 2014-11-26 DIAGNOSIS — E7800 Pure hypercholesterolemia, unspecified: Secondary | ICD-10-CM | POA: Diagnosis not present

## 2014-11-26 DIAGNOSIS — K219 Gastro-esophageal reflux disease without esophagitis: Secondary | ICD-10-CM | POA: Insufficient documentation

## 2014-11-26 DIAGNOSIS — Z720 Tobacco use: Secondary | ICD-10-CM | POA: Insufficient documentation

## 2014-11-26 DIAGNOSIS — Z79899 Other long term (current) drug therapy: Secondary | ICD-10-CM | POA: Insufficient documentation

## 2014-11-26 DIAGNOSIS — G43909 Migraine, unspecified, not intractable, without status migrainosus: Secondary | ICD-10-CM | POA: Diagnosis not present

## 2014-11-26 LAB — TROPONIN I

## 2014-11-26 LAB — COMPREHENSIVE METABOLIC PANEL
ALBUMIN: 4.2 g/dL (ref 3.5–5.0)
ALT: 19 U/L (ref 14–54)
ANION GAP: 8 (ref 5–15)
AST: 21 U/L (ref 15–41)
Alkaline Phosphatase: 63 U/L (ref 38–126)
BILIRUBIN TOTAL: 0.7 mg/dL (ref 0.3–1.2)
BUN: 12 mg/dL (ref 6–20)
CHLORIDE: 107 mmol/L (ref 101–111)
CO2: 24 mmol/L (ref 22–32)
Calcium: 9.3 mg/dL (ref 8.9–10.3)
Creatinine, Ser: 0.54 mg/dL (ref 0.44–1.00)
GFR calc Af Amer: >60 mL/min (ref >60)
GFR calc non Af Amer: >60 mL/min (ref >60)
GLUCOSE: 81 mg/dL (ref 65–99)
POTASSIUM: 3.2 mmol/L — AB (ref 3.5–5.1)
SODIUM: 139 mmol/L (ref 135–145)
TOTAL PROTEIN: 7.5 g/dL (ref 6.5–8.1)

## 2014-11-26 LAB — CBC
HEMATOCRIT: 39.5 % (ref 36.0–46.0)
Hemoglobin: 13.3 g/dL (ref 12.0–15.0)
MCH: 31 pg (ref 26.0–34.0)
MCHC: 33.7 g/dL (ref 30.0–36.0)
MCV: 92.1 fL (ref 78.0–100.0)
PLATELETS: 262 10*3/uL (ref 150–400)
RBC: 4.29 MIL/uL (ref 3.87–5.11)
RDW: 13.7 % (ref 11.5–15.5)
WBC: 4.7 10*3/uL (ref 4.0–10.5)

## 2014-11-26 LAB — I-STAT TROPONIN, ED: TROPONIN I, POC: 0 ng/mL (ref 0.00–0.08)

## 2014-11-26 LAB — PROTIME-INR
INR: 1.01 (ref 0.00–1.49)
Prothrombin Time: 13.5 seconds (ref 11.6–15.2)

## 2014-11-26 LAB — MAGNESIUM: Magnesium: 2 mg/dL (ref 1.7–2.4)

## 2014-11-26 MED ORDER — ACETAMINOPHEN 500 MG PO TABS
1000.0000 mg | ORAL_TABLET | Freq: Once | ORAL | Status: AC
  Administered 2014-11-26: 1000 mg via ORAL
  Filled 2014-11-26: qty 2

## 2014-11-26 MED ORDER — ASPIRIN 81 MG PO CHEW
324.0000 mg | CHEWABLE_TABLET | Freq: Once | ORAL | Status: AC
  Administered 2014-11-26: 324 mg via ORAL
  Filled 2014-11-26: qty 4

## 2014-11-26 MED ORDER — HYDROCHLOROTHIAZIDE 12.5 MG PO CAPS
25.0000 mg | ORAL_CAPSULE | Freq: Once | ORAL | Status: AC
  Administered 2014-11-26: 25 mg via ORAL
  Filled 2014-11-26: qty 2

## 2014-11-26 MED ORDER — LISINOPRIL 20 MG PO TABS: 20.0000 mg | ORAL_TABLET | Freq: Two times a day (BID) | ORAL | Status: DC

## 2014-11-26 MED ORDER — GI COCKTAIL ~~LOC~~
30.0000 mL | Freq: Once | ORAL | Status: AC
  Administered 2014-11-26: 30 mL via ORAL
  Filled 2014-11-26: qty 30

## 2014-11-26 MED ORDER — HYDROCHLOROTHIAZIDE 25 MG PO TABS: 25.0000 mg | ORAL_TABLET | Freq: Two times a day (BID) | ORAL | Status: DC

## 2014-11-26 NOTE — ED Provider Notes (Signed)
CSN: [id number]     Arrival date & time 11/26/14  1207 History   First MD Initiated Contact with Patient 11/26/14 1229     Chief Complaint  Patient presents with  . Headache  . Chest Pain     (Consider location/radiation/quality/duration/timing/severity/associated sxs/prior Treatment) HPI Patient presents with concern of chest pain, lightheadedness, nausea. Symptoms began today about 7 hours ago. Yesterday she had mild nonspecific his comfort, but today, upon awakening, she had headache, mild generalized discomfort, weakness. She soon thereafter developed nausea. Subsequently, the patient had 2 episodes of vomiting, no chest pain. Chest pain is epigastric, sternal, burning, sharp. No additional episodes of vomiting. Mild associated ongoing generalized discomfort, nausea. No fever, chills, weight loss, we can, dyspnea. Patient smokes. Patient has checked her blood pressure multiple times throughout the illness, found to be elevated, with diastolic greater than 086 multiple times.   Smoking cessation provided, particularly in light of this patient's evaluation in the ED.  Past Medical History  Diagnosis Date  . Hypertension   . Migraine   . High cholesterol   . Scoliosis   . GERD (gastroesophageal reflux disease)   . Back pain    Past Surgical History  Procedure Laterality Date  . Abdominal hysterectomy    . Cesarean section    . Abdominal surgery     Family History  Problem Relation Age of Onset  . Hypertension Mother   . Stroke Mother   . Cancer Mother   . Diabetes Mother   . Heart attack Father    Social History  Substance Use Topics  . Smoking status: Current Every Day Smoker -- 1.00 packs/day for 15 years    Types: Cigarettes  . Smokeless tobacco: Never Used  . Alcohol Use: Yes     Comment: social   OB History    No data available     Review of Systems  Constitutional:       Per HPI, otherwise negative  HENT:       Per HPI, otherwise negative   Respiratory:       Per HPI, otherwise negative  Cardiovascular:       Per HPI, otherwise negative  Gastrointestinal: Positive for nausea and vomiting.  Endocrine:       Negative aside from HPI  Genitourinary:       Neg aside from HPI   Musculoskeletal:       Per HPI, otherwise negative  Skin: Negative.   Neurological: Negative for syncope.      Allergies  Review of patient's allergies indicates no known allergies.  Home Medications   Prior to Admission medications   Medication Sig Start Date End Date Taking? Authorizing Provider  Aspirin-Acetaminophen-Caffeine (EXCEDRIN MIGRAINE PO) Take 2 tablets by mouth daily as needed (migraine).    Historical Provider, MD  famotidine (PEPCID) 20 MG tablet Take 1 tablet (20 mg total) by mouth 2 (two) times daily. 09/14/13   Jennifer Piepenbrink, PA-C  lisinopril-hydrochlorothiazide (PRINZIDE,ZESTORETIC) 20-12.5 MG per tablet Take 2 tablets by mouth daily.    Historical Provider, MD  ondansetron (ZOFRAN ODT) 4 MG disintegrating tablet Take 1 tablet (4 mg total) by mouth every 8 (eight) hours as needed for nausea or vomiting. 09/14/13   Baron Sane, PA-C  potassium chloride (MICRO-K) 10 MEQ CR capsule Take 10 mEq by mouth daily. 08/14/13   Historical Provider, MD  simvastatin (ZOCOR) 20 MG tablet Take 20 mg by mouth at bedtime.    Historical Provider, MD  SUMAtriptan (IMITREX) 100  MG tablet Take 1 tablet (100 mg total) by mouth once as needed for migraine. May repeat x 1 after 2 hours; maximum 2 tabs per day and 8 tabs per month 07/08/13   Penni Bombard, MD  topiramate (TOPAMAX) 100 MG tablet Take 100 mg by mouth 2 (two) times daily.    Historical Provider, MD   BP 138/97 mmHg  Pulse 70  Temp(Src) 97.9 F (36.6 C) (Oral)  Resp 18  SpO2 99% Physical Exam  Constitutional: She is oriented to person, place, and time. She appears well-developed and well-nourished. No distress.  HENT:  Head: Normocephalic and atraumatic.  Eyes:  Conjunctivae and EOM are normal.  Cardiovascular: Normal rate and regular rhythm.   Pulmonary/Chest: Effort normal and breath sounds normal. No stridor. No respiratory distress.  Abdominal: She exhibits no distension.  Musculoskeletal: She exhibits no edema.  Neurological: She is alert and oriented to person, place, and time. No cranial nerve deficit.  Skin: Skin is warm and dry.  Psychiatric: She has a normal mood and affect.  Nursing note and vitals reviewed.   ED Course  Procedures (including critical care time)    Imaging Review Dg Chest 2 View  11/26/2014   CLINICAL DATA:  Mid chest pain since last night.  EXAM: CHEST  2 VIEW  COMPARISON:  March 12, 2013  FINDINGS: The heart size and mediastinal contours are within normal limits. Both lungs are clear. The visualized skeletal structures are unremarkable.  IMPRESSION: No active cardiopulmonary disease.   Electronically Signed   By: Abelardo Diesel M.D.   On: 11/26/2014 13:50   I have personally reviewed and evaluated these images and lab results as part of my medical decision-making.   EKG Interpretation   Date/Time:  Thursday November 26 2014 12:20:50 EDT Ventricular Rate:  71 PR Interval:  183 QRS Duration: 82 QT Interval:  411 QTC Calculation: 447 R Axis:   66 Text Interpretation:  Sinus rhythm Anteroseptal infarct, age indeterminate  Sinus rhythm T wave abnormality Artifact Abnormal ekg Confirmed by  Carmin Muskrat  MD ([id number]) on 11/26/2014 12:52:28 PM     O2- 99%ra, nml Cardiac: 70 sr, nml  3:46 PM Patient in no distress, seemingly calm, resting in bed. We discussed all findings, no change medication, need for primary care follow-up.  MDM  Agent presents after an episode of lightheadedness, epigastric discomfort, headache. Symptoms likely secondary to elevated blood pressure, with no evidence for end organ effects, nor ACS. No evidence for infection, HD decompensation, and the patient presents initially here with  medication, including increased home dosing of her blood pressure meds.  Carmin Muskrat, MD 11/26/14 [phone]

## 2014-11-26 NOTE — Discharge Instructions (Signed)
As discussed, your evaluation today has been largely reassuring.  But, it is important that you monitor your condition carefully, and do not hesitate to return to the ED if you develop new, or concerning changes in your condition.  Please take your newly prescribed medication, which is effectively a change in your typical medication, and take note of any changes in your symptoms.  Please follow-up with your physician for appropriate ongoing care.

## 2014-11-26 NOTE — ED Notes (Signed)
Lab delay - tech and doctor in with patient.

## 2014-11-26 NOTE — ED Notes (Addendum)
Pt complaining of a headache and chest pain that started when she woke up this morning. States she's felt like this once before around a year ago when they had to "tweak" her blood pressure medication. States she was also feeling more forgetful than normal. Brief neuro exam WNL in triage.

## 2014-12-26 ENCOUNTER — Other Ambulatory Visit: Payer: Self-pay | Admitting: Obstetrics and Gynecology

## 2014-12-26 DIAGNOSIS — R928 Other abnormal and inconclusive findings on diagnostic imaging of breast: Secondary | ICD-10-CM

## 2014-12-28 ENCOUNTER — Other Ambulatory Visit: Payer: Self-pay | Admitting: Obstetrics and Gynecology

## 2014-12-28 DIAGNOSIS — R928 Other abnormal and inconclusive findings on diagnostic imaging of breast: Secondary | ICD-10-CM

## 2015-01-01 ENCOUNTER — Ambulatory Visit
Admission: RE | Admit: 2015-01-01 | Discharge: 2015-01-01 | Disposition: A | Discharge status: Home / Self Care | Payer: BLUE CROSS/BLUE SHIELD | Source: Ambulatory Visit | Attending: Obstetrics and Gynecology | Admitting: Obstetrics and Gynecology

## 2015-01-01 DIAGNOSIS — R928 Other abnormal and inconclusive findings on diagnostic imaging of breast: Secondary | ICD-10-CM

## 2015-01-11 ENCOUNTER — Ambulatory Visit
Admission: RE | Admit: 2015-01-11 | Discharge: 2015-01-11 | Disposition: A | Discharge status: Home / Self Care | Payer: BLUE CROSS/BLUE SHIELD | Source: Ambulatory Visit | Attending: Obstetrics and Gynecology | Admitting: Obstetrics and Gynecology

## 2015-01-11 ENCOUNTER — Other Ambulatory Visit: Payer: Self-pay

## 2015-04-12 ENCOUNTER — Encounter (HOSPITAL_BASED_OUTPATIENT_CLINIC_OR_DEPARTMENT_OTHER): Payer: Self-pay | Admitting: Emergency Medicine

## 2015-04-12 ENCOUNTER — Emergency Department (HOSPITAL_BASED_OUTPATIENT_CLINIC_OR_DEPARTMENT_OTHER): Payer: BLUE CROSS/BLUE SHIELD

## 2015-04-12 ENCOUNTER — Emergency Department (HOSPITAL_COMMUNITY)
Admission: EM | Admit: 2015-04-12 | Discharge: 2015-04-12 | Disposition: A | Discharge status: Home / Self Care | Payer: BLUE CROSS/BLUE SHIELD | Source: Home / Self Care

## 2015-04-12 ENCOUNTER — Emergency Department (HOSPITAL_BASED_OUTPATIENT_CLINIC_OR_DEPARTMENT_OTHER)
Admission: EM | Admit: 2015-04-12 | Discharge: 2015-04-12 | Disposition: A | Discharge status: Home / Self Care | Payer: BLUE CROSS/BLUE SHIELD | Attending: Emergency Medicine | Admitting: Emergency Medicine

## 2015-04-12 ENCOUNTER — Encounter (HOSPITAL_COMMUNITY): Payer: Self-pay | Admitting: Emergency Medicine

## 2015-04-12 DIAGNOSIS — R109 Unspecified abdominal pain: Secondary | ICD-10-CM

## 2015-04-12 DIAGNOSIS — F17210 Nicotine dependence, cigarettes, uncomplicated: Secondary | ICD-10-CM | POA: Insufficient documentation

## 2015-04-12 DIAGNOSIS — R1031 Right lower quadrant pain: Secondary | ICD-10-CM | POA: Diagnosis present

## 2015-04-12 DIAGNOSIS — E7800 Pure hypercholesterolemia, unspecified: Secondary | ICD-10-CM | POA: Insufficient documentation

## 2015-04-12 DIAGNOSIS — B349 Viral infection, unspecified: Secondary | ICD-10-CM | POA: Insufficient documentation

## 2015-04-12 DIAGNOSIS — I10 Essential (primary) hypertension: Secondary | ICD-10-CM | POA: Insufficient documentation

## 2015-04-12 DIAGNOSIS — Z7982 Long term (current) use of aspirin: Secondary | ICD-10-CM | POA: Insufficient documentation

## 2015-04-12 DIAGNOSIS — Z79899 Other long term (current) drug therapy: Secondary | ICD-10-CM | POA: Insufficient documentation

## 2015-04-12 DIAGNOSIS — Z8739 Personal history of other diseases of the musculoskeletal system and connective tissue: Secondary | ICD-10-CM | POA: Diagnosis not present

## 2015-04-12 DIAGNOSIS — R1110 Vomiting, unspecified: Secondary | ICD-10-CM

## 2015-04-12 DIAGNOSIS — G43909 Migraine, unspecified, not intractable, without status migrainosus: Secondary | ICD-10-CM | POA: Diagnosis not present

## 2015-04-12 DIAGNOSIS — Z8719 Personal history of other diseases of the digestive system: Secondary | ICD-10-CM | POA: Insufficient documentation

## 2015-04-12 LAB — CBC WITH DIFFERENTIAL/PLATELET
Basophils Absolute: 0.1 10*3/uL (ref 0.0–0.1)
Basophils Relative: 1 %
Eosinophils Absolute: 0.2 10*3/uL (ref 0.0–0.7)
Eosinophils Relative: 4 %
HCT: 38.3 % (ref 36.0–46.0)
Hemoglobin: 12.8 g/dL (ref 12.0–15.0)
Lymphocytes Relative: 43 %
Lymphs Abs: 2.4 10*3/uL (ref 0.7–4.0)
MCH: 30.3 pg (ref 26.0–34.0)
MCHC: 33.4 g/dL (ref 30.0–36.0)
MCV: 90.5 fL (ref 78.0–100.0)
Monocytes Absolute: 0.4 10*3/uL (ref 0.1–1.0)
Monocytes Relative: 8 %
Neutro Abs: 2.4 10*3/uL (ref 1.7–7.7)
Neutrophils Relative %: 44 %
Platelets: 236 10*3/uL (ref 150–400)
RBC: 4.23 MIL/uL (ref 3.87–5.11)
RDW: 14.7 % (ref 11.5–15.5)
WBC: 5.5 10*3/uL (ref 4.0–10.5)

## 2015-04-12 LAB — URINALYSIS, ROUTINE W REFLEX MICROSCOPIC
Glucose, UA: NEGATIVE mg/dL
HGB URINE DIPSTICK: NEGATIVE
Ketones, ur: 15 mg/dL — AB
Leukocytes, UA: NEGATIVE
Nitrite: POSITIVE — AB
PH: 6 (ref 5.0–8.0)
Protein, ur: NEGATIVE mg/dL
SPECIFIC GRAVITY, URINE: 1.03 (ref 1.005–1.030)

## 2015-04-12 LAB — COMPREHENSIVE METABOLIC PANEL
ALT: 16 U/L (ref 14–54)
AST: 15 U/L (ref 15–41)
Albumin: 3.7 g/dL (ref 3.5–5.0)
Alkaline Phosphatase: 57 U/L (ref 38–126)
Anion gap: 7 (ref 5–15)
BUN: 12 mg/dL (ref 6–20)
CO2: 26 mmol/L (ref 22–32)
Calcium: 8.4 mg/dL — ABNORMAL LOW (ref 8.9–10.3)
Chloride: 106 mmol/L (ref 101–111)
Creatinine, Ser: 0.5 mg/dL (ref 0.44–1.00)
GFR calc Af Amer: >60 mL/min (ref >60)
GFR calc non Af Amer: >60 mL/min (ref >60)
Glucose, Bld: 87 mg/dL (ref 65–99)
Potassium: 3.2 mmol/L — ABNORMAL LOW (ref 3.5–5.1)
Sodium: 139 mmol/L (ref 135–145)
Total Bilirubin: 0.9 mg/dL (ref 0.3–1.2)
Total Protein: 6.3 g/dL — ABNORMAL LOW (ref 6.5–8.1)

## 2015-04-12 LAB — URINE MICROSCOPIC-ADD ON

## 2015-04-12 LAB — LIPASE, BLOOD: Lipase: 32 U/L (ref 11–51)

## 2015-04-12 MED ORDER — IOHEXOL 300 MG/ML  SOLN
100.0000 mL | Freq: Once | INTRAMUSCULAR | Status: AC | PRN
  Administered 2015-04-12: 100 mL via INTRAVENOUS

## 2015-04-12 MED ORDER — IOHEXOL 300 MG/ML  SOLN
25.0000 mL | Freq: Once | INTRAMUSCULAR | Status: AC | PRN
  Administered 2015-04-12: 25 mL via ORAL

## 2015-04-12 NOTE — ED Notes (Signed)
Patient is drinking water in triage, this rn made here aware that she needs to not eat or drink anything. Reports that she was unaware that she was being worked up for appendicitis.

## 2015-04-12 NOTE — ED Notes (Signed)
Per EMS, Pt from doctor's office to be seen for abdominal pain, N/V since this weekend. Pt particularly tender on R side. Vitals stable. Pt here for R/O appy. A&Ox4, ambulatory.

## 2015-04-12 NOTE — Discharge Instructions (Signed)

## 2015-04-12 NOTE — ED Notes (Signed)
Patient stats that she went to her MD offices and they called 911 due to her lower abdominal pain. The patient reports that she is having bilateral lower abdominal pain. The patient went to [facility] waited for 3 hours left and came here. The patient is evasive in triage and playing with her phone.

## 2015-04-12 NOTE — ED Provider Notes (Signed)
CSN: [id number]     Arrival date & time 04/12/15  1532 History   First MD Initiated Contact with Patient 04/12/15 1654     Chief Complaint  Patient presents with  . Abdominal Pain    HPI   45 year old female presents today at the request of her primary care provider for evaluation of right lower quadrant abdominal pain. Patient reports that starting on Friday she's had abdominal discomfort that is localized to her right lower quadrant. She reports nausea vomiting and diarrhea since then. She states that she contacted her primary care Friday who prescribed her Zofran, she notes using this over the last several days with improvement in her symptoms. Patient reports today she has had no episodes of vomiting without use of Zofran. She continues to endorse abdominal discomfort, urinary frequency, but denies any change in the color clarity or characteristics of her urine. Patient notes that she take AZO to help improve her urinary tract infection. Patient denies any rhinorrhea, notes dry nonproductive cough, denies any chest pain or shortness of breath, upper abdominal pain, swelling or edema to the lower extremities. She denies any exposure to abnormal food or drink, anyone in close contact with similar symptoms. Patient's primary care provider diagnose her with influenza but wanted evaluation for potential appendicitis. Patient is a smoker, history of hypertension.   Past Medical History  Diagnosis Date  . Hypertension   . Migraine   . High cholesterol   . Scoliosis   . GERD (gastroesophageal reflux disease)   . Back pain    Past Surgical History  Procedure Laterality Date  . Abdominal hysterectomy    . Cesarean section    . Abdominal surgery     Family History  Problem Relation Age of Onset  . Hypertension Mother   . Stroke Mother   . Cancer Mother   . Diabetes Mother   . Heart attack Father    Social History  Substance Use Topics  . Smoking status: Current Every Day Smoker -- 1.00  packs/day for 15 years    Types: Cigarettes  . Smokeless tobacco: Never Used  . Alcohol Use: Yes     Comment: social   OB History    No data available     Review of Systems  All other systems reviewed and are negative.   Allergies  Review of patient's allergies indicates no known allergies.  Home Medications   Prior to Admission medications   Medication Sig Start Date End Date Taking? Authorizing Provider  amitriptyline (ELAVIL) 10 MG tablet Take 30 mg by mouth at bedtime.   Yes Historical Provider, MD  estradiol (VIVELLE-DOT) 0.05 MG/24HR patch Place 1 patch onto the skin 2 (two) times a week.   Yes Historical Provider, MD  Aspirin-Acetaminophen-Caffeine (GOODY HEADACHE PO) Take 1 Package by mouth 3 (three) times daily as needed (headache).    Historical Provider, MD  hydrochlorothiazide (HYDRODIURIL) 25 MG tablet Take 1 tablet (25 mg total) by mouth 2 (two) times daily. 11/26/14   Carmin Muskrat, MD  lisinopril (PRINIVIL,ZESTRIL) 20 MG tablet Take 1 tablet (20 mg total) by mouth 2 (two) times daily. 11/26/14   Carmin Muskrat, MD  potassium chloride (MICRO-K) 10 MEQ CR capsule Take 10 mEq by mouth daily. 08/14/13   Historical Provider, MD  simvastatin (ZOCOR) 20 MG tablet Take 20 mg by mouth at bedtime.    Historical Provider, MD  SUMAtriptan (IMITREX) 100 MG tablet Take 1 tablet (100 mg total) by mouth once as needed for  migraine. May repeat x 1 after 2 hours; maximum 2 tabs per day and 8 tabs per month Patient not taking: Reported on 11/26/2014 07/08/13   Penni Bombard, MD  zonisamide (ZONEGRAN) 25 MG capsule Take 100 mg by mouth at bedtime. 09/21/14   Historical Provider, MD   BP 136/93 mmHg  Pulse 56  Temp(Src) 98.6 F (37 C) (Oral)  Resp 16  Ht 5\' 5"  (1.651 m)  Wt 76.658 kg  BMI 28.12 kg/m2  SpO2 99%   Physical Exam  Constitutional: She is oriented to person, place, and time. She appears well-developed and well-nourished.  HENT:  Head: Normocephalic and atraumatic.   Eyes: Conjunctivae are normal. Pupils are equal, round, and reactive to light. Right eye exhibits no discharge. Left eye exhibits no discharge. No scleral icterus.  Neck: Normal range of motion. No JVD present. No tracheal deviation present.  Cardiovascular: Normal rate, regular rhythm, normal heart sounds and intact distal pulses.  Exam reveals no gallop and no friction rub.   No murmur heard. Pulmonary/Chest: Effort normal and breath sounds normal. No stridor. No respiratory distress. She has no wheezes. She has no rales. She exhibits no tenderness.  Abdominal: Soft. Bowel sounds are normal. She exhibits no distension and no mass. There is tenderness. There is no rebound and no guarding.  Tenderness to palpation of the abdomen diffusely, with worse abdominal tenderness over the right lower quadrant  Musculoskeletal: Normal range of motion. She exhibits no edema or tenderness.  Neurological: She is alert and oriented to person, place, and time. Coordination normal.  Skin: Skin is warm and dry. No rash noted. No erythema. No pallor.  Psychiatric: She has a normal mood and affect. Her behavior is normal. Judgment and thought content normal.  Nursing note and vitals reviewed.   ED Course  Procedures (including critical care time) Labs Review Labs Reviewed  URINALYSIS, ROUTINE W REFLEX MICROSCOPIC (NOT AT [facility]) - Abnormal; Notable for the following:    Color, Urine ORANGE (*)    APPearance CLOUDY (*)    Bilirubin Urine SMALL (*)    Ketones, ur 15 (*)    Nitrite POSITIVE (*)    All other components within normal limits  URINE MICROSCOPIC-ADD ON - Abnormal; Notable for the following:    Squamous Epithelial / LPF 0-5 (*)    Bacteria, UA MANY (*)    Casts GRANULAR CAST (*)    All other components within normal limits  COMPREHENSIVE METABOLIC PANEL - Abnormal; Notable for the following:    Potassium 3.2 (*)    Calcium 8.4 (*)    Total Protein 6.3 (*)    All other components within normal  limits  CBC WITH DIFFERENTIAL/PLATELET  LIPASE, BLOOD    Imaging Review Ct Abdomen Pelvis W Contrast  04/12/2015  CLINICAL DATA:  Per EMS, Pt from doctor's office to be seen for abdominal pain, N/V since this weekend. Pt particularly tender on R side Hx HTN, Migriaine, Scoliosis, GERD, Chronic back pain, hysterectomy EXAM: CT ABDOMEN AND PELVIS WITH CONTRAST TECHNIQUE: Multidetector CT imaging of the abdomen and pelvis was performed using the standard protocol following bolus administration of intravenous contrast. CONTRAST:  117mL OMNIPAQUE IOHEXOL 300 MG/ML SOLN, 34mL OMNIPAQUE IOHEXOL 300 MG/ML SOLN COMPARISON:  10/28/2014 FINDINGS: Lower chest:  No acute findings. Hepatobiliary: No masses or other significant abnormality. Pancreas: No mass, inflammatory changes, or other significant abnormality. Spleen: Within normal limits in size. Stable 15 mm cyst at its anteromedial margin. Adrenals/Urinary Tract: No masses identified.  No evidence of hydronephrosis. Stomach/Bowel: No evidence of obstruction, inflammatory process, or abnormal fluid collections. Normal appendix. Scattered sigmoid diverticula. Vascular/Lymphatic: No pathologically enlarged lymph nodes. No evidence of abdominal aortic aneurysm. Reproductive: Previous hysterectomy. Right ovarian cysts probably physiologic. Other: No ascites.  No free air.  Bilateral pelvic phleboliths. Musculoskeletal: No suspicious bone lesions identified. Partially calcified disc protrusions in the visualized lower thoracic spine. IMPRESSION: 1. No acute abnormality.  Normal appendix. 2. Scattered sigmoid diverticula. Electronically Signed   By: Lucrezia Europe M.D.   On: 04/12/2015 19:24   I have personally reviewed and evaluated these images and lab results as part of my medical decision-making.   EKG Interpretation None      MDM   Final diagnoses:  Viral illness  Right lower quadrant abdominal pain    Labs: Urinalysis and CBC, CMP, lipase-no significant  findings   Imaging: CT abdomen and pelvis with contrast  Consults:  Therapeutics:  Discharge Meds:   Assessment/Plan: 45 year old female presents today with request of her primary care provider for evaluation of potential appendicitis. Patient diagnosed her with likely pneumonia but wanted her to be evaluated for appendicitis to her right lower quadrant pain. CT scan here shows no acute abnormality. Patient is afebrile, nontoxic, tolerating by mouth. She has reassuring vital signs and laboratory analysis here. This could likely represent a viral infection including pneumonia. Patient is stable, will be instructed to drink plenty fluids, rest, follow-up with primary care and 2-3 days for reevaluation. She is instructed to questions, verbalized understanding and agreement with today's plan and had no further questions or concerns at time of discharge.          Okey Regal, PA-C 04/12/15 2026  Forde Dandy, MD 04/13/15 [id number]

## 2015-04-14 LAB — URINE CULTURE
Culture: 1000
Special Requests: NORMAL

## 2015-08-30 ENCOUNTER — Other Ambulatory Visit: Payer: Self-pay | Admitting: Obstetrics and Gynecology

## 2015-08-30 DIAGNOSIS — N6489 Other specified disorders of breast: Secondary | ICD-10-CM

## 2016-04-12 ENCOUNTER — Emergency Department (HOSPITAL_COMMUNITY)
Admission: EM | Admit: 2016-04-12 | Discharge: 2016-04-12 | Disposition: A | Discharge status: Home / Self Care | Payer: BLUE CROSS/BLUE SHIELD | Attending: Emergency Medicine | Admitting: Emergency Medicine

## 2016-04-12 ENCOUNTER — Emergency Department (HOSPITAL_COMMUNITY): Payer: BLUE CROSS/BLUE SHIELD

## 2016-04-12 ENCOUNTER — Encounter (HOSPITAL_COMMUNITY): Payer: Self-pay | Admitting: *Deleted

## 2016-04-12 DIAGNOSIS — R0602 Shortness of breath: Secondary | ICD-10-CM | POA: Insufficient documentation

## 2016-04-12 DIAGNOSIS — R079 Chest pain, unspecified: Secondary | ICD-10-CM | POA: Diagnosis not present

## 2016-04-12 DIAGNOSIS — I10 Essential (primary) hypertension: Secondary | ICD-10-CM | POA: Insufficient documentation

## 2016-04-12 DIAGNOSIS — F17210 Nicotine dependence, cigarettes, uncomplicated: Secondary | ICD-10-CM | POA: Diagnosis not present

## 2016-04-12 DIAGNOSIS — Z7982 Long term (current) use of aspirin: Secondary | ICD-10-CM | POA: Diagnosis not present

## 2016-04-12 DIAGNOSIS — Z79899 Other long term (current) drug therapy: Secondary | ICD-10-CM | POA: Insufficient documentation

## 2016-04-12 LAB — CBC
HCT: 42 % (ref 36.0–46.0)
HEMOGLOBIN: 14.3 g/dL (ref 12.0–15.0)
MCH: 30.4 pg (ref 26.0–34.0)
MCHC: 34 g/dL (ref 30.0–36.0)
MCV: 89.4 fL (ref 78.0–100.0)
PLATELETS: 297 10*3/uL (ref 150–400)
RBC: 4.7 MIL/uL (ref 3.87–5.11)
RDW: 14.5 % (ref 11.5–15.5)
WBC: 8.5 10*3/uL (ref 4.0–10.5)

## 2016-04-12 LAB — D-DIMER, QUANTITATIVE: D-Dimer, Quant: 0.27 ug/mL-FEU (ref 0.00–0.50)

## 2016-04-12 LAB — BRAIN NATRIURETIC PEPTIDE: B Natriuretic Peptide: 16.6 pg/mL (ref 0.0–100.0)

## 2016-04-12 LAB — BASIC METABOLIC PANEL
Anion gap: 12 (ref 5–15)
BUN: 12 mg/dL (ref 6–20)
CALCIUM: 9.7 mg/dL (ref 8.9–10.3)
CO2: 23 mmol/L (ref 22–32)
CREATININE: 0.87 mg/dL (ref 0.44–1.00)
Chloride: 104 mmol/L (ref 101–111)
GFR calc Af Amer: >60 mL/min (ref >60)
GFR calc non Af Amer: >60 mL/min (ref >60)
GLUCOSE: 260 mg/dL — AB (ref 65–99)
Potassium: 3.4 mmol/L — ABNORMAL LOW (ref 3.5–5.1)
Sodium: 139 mmol/L (ref 135–145)

## 2016-04-12 LAB — I-STAT TROPONIN, ED
TROPONIN I, POC: 0 ng/mL (ref 0.00–0.08)
Troponin i, poc: 0 ng/mL (ref 0.00–0.08)

## 2016-04-12 MED ORDER — GI COCKTAIL ~~LOC~~
30.0000 mL | Freq: Once | ORAL | Status: AC
Start: 2016-04-12 — End: 2016-04-12
  Administered 2016-04-12: 30 mL via ORAL
  Filled 2016-04-12: qty 30

## 2016-04-12 NOTE — ED Provider Notes (Signed)
[facility] DEPT Provider Note   CSN: [id number] Arrival date & time: 04/12/16  1020     History   Chief Complaint Chief Complaint  Patient presents with  . Shortness of Breath  . Chest Pain    HPI [name] is a 46 y.o. female.  46 yo F with a chief complaint of chest pain shortness of breath. This been going on for the past 2 weeks. Associated with an upper respiratory infection. Patient was treated initially for pneumonia and then treated for the flu as well. Continuing to feel very weak and short of breath. Having trouble getting around and moving. Denies any lower extremity swelling denies hemoptysis denies recent surgery denies oral contraceptive use.   The history is provided by the patient.  Shortness of Breath  This is a new problem. The average episode lasts 2 weeks. The problem occurs continuously.The current episode started more than 1 week ago. The problem has been gradually worsening. Associated symptoms include chest pain. Pertinent negatives include no fever, no headaches, no rhinorrhea, no wheezing, no vomiting and no rash. She has tried nothing for the symptoms. The treatment provided no relief. Associated medical issues do not include PE or CAD.  Chest Pain   Associated symptoms include shortness of breath. Pertinent negatives include no dizziness, no fever, no headaches, no nausea, no palpitations and no vomiting.  Pertinent negatives for past medical history include no CAD and no PE.    Past Medical History:  Diagnosis Date  . Back pain   . GERD (gastroesophageal reflux disease)   . High cholesterol   . Hypertension   . Migraine   . Scoliosis     Patient Active Problem List   Diagnosis Date Noted  . Migraine with aura 07/08/2013  . 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 ABDOMINAL HYSTERECTOMY    . ABDOMINAL SURGERY    . CESAREAN SECTION      OB History    No data available        Home Medications    Prior to Admission medications   Medication Sig Start Date End Date Taking? Authorizing Provider  Aspirin-Acetaminophen-Caffeine (GOODY HEADACHE PO) Take 1 Package by mouth 3 (three) times daily as needed (headache).   Yes Historical Provider, MD  estradiol (VIVELLE-DOT) 0.05 MG/24HR patch Place 1 patch onto the skin 2 (two) times a week.   Yes Historical Provider, MD  hydrochlorothiazide (HYDRODIURIL) 25 MG tablet Take 1 tablet (25 mg total) by mouth 2 (two) times daily. Patient taking differently: Take 25 mg by mouth daily.  11/26/14  Yes Carmin Muskrat, MD  levofloxacin (LEVAQUIN) 500 MG tablet Take 500 mg by mouth daily. 04/11/16  Yes Historical Provider, MD  lisinopril (PRINIVIL,ZESTRIL) 20 MG tablet Take 1 tablet (20 mg total) by mouth 2 (two) times daily. 11/26/14  Yes Carmin Muskrat, MD  potassium chloride (MICRO-K) 10 MEQ CR capsule Take 10 mEq by mouth daily. 08/14/13  Yes Historical Provider, MD  predniSONE (DELTASONE) 10 MG tablet Take 10-40 mg by mouth See admin instructions. Take 4 tabs daily for 3 days, then reduce by one tab every 3 days until complete 04/05/16  Yes Historical Provider, MD  simvastatin (ZOCOR) 20 MG tablet Take 20 mg by mouth at bedtime.   Yes Historical Provider, MD  topiramate (TOPAMAX) 25 MG tablet Take 25 mg by mouth 2 (two) times daily. 04/05/16  Yes Historical Provider, MD  SUMAtriptan (IMITREX) 100 MG  tablet Take 1 tablet (100 mg total) by mouth once as needed for migraine. May repeat x 1 after 2 hours; maximum 2 tabs per day and 8 tabs per month Patient not taking: Reported on 11/26/2014 07/08/13   Penni Bombard, MD    Family History Family History  Problem Relation Age of Onset  . Hypertension Mother   . Stroke Mother   . Cancer Mother   . Diabetes Mother   . Heart attack Father     Social History Social History  Substance Use Topics  . Smoking status: Current Every Day Smoker    Packs/day: 1.00    Years: 15.00     Types: Cigarettes  . Smokeless tobacco: Never Used  . Alcohol use Yes     Comment: social     Allergies   Patient has no known allergies.   Review of Systems Review of Systems  Constitutional: Negative for chills and fever.  HENT: Negative for congestion and rhinorrhea.   Eyes: Negative for redness and visual disturbance.  Respiratory: Positive for shortness of breath. Negative for wheezing.   Cardiovascular: Positive for chest pain. Negative for palpitations.  Gastrointestinal: Negative for nausea and vomiting.  Genitourinary: Negative for dysuria and urgency.  Musculoskeletal: Negative for arthralgias and myalgias.  Skin: Negative for pallor, rash and wound.  Neurological: Negative for dizziness and headaches.     Physical Exam Updated Vital Signs BP 115/76   Pulse 64   Temp 97.5 F (36.4 C) (Oral)   Resp 11   Ht 5\' 2"  (1.575 m)   Wt 183 lb (83 kg)   SpO2 100%   BMI 33.47 kg/m   Physical Exam  Constitutional: She is oriented to person, place, and time. She appears well-developed and well-nourished. No distress.  HENT:  Head: Normocephalic and atraumatic.  Eyes: EOM are normal. Pupils are equal, round, and reactive to light.  Neck: Normal range of motion. Neck supple.  Cardiovascular: Normal rate and regular rhythm.  Exam reveals no gallop and no friction rub.   No murmur heard. Pulmonary/Chest: Effort normal. She has no wheezes. She has no rales.  Abdominal: Soft. She exhibits no distension and no mass. There is no tenderness. There is no guarding.  Musculoskeletal: She exhibits no edema or tenderness.  Neurological: She is alert and oriented to person, place, and time.  Skin: Skin is warm and dry. She is not diaphoretic.  Psychiatric: She has a normal mood and affect. Her behavior is normal.  Nursing note and vitals reviewed.    ED Treatments / Results  Labs (all labs ordered are listed, but only abnormal results are displayed) Labs Reviewed  BASIC  METABOLIC PANEL - Abnormal; Notable for the following:       Result Value   Potassium 3.4 (*)    Glucose, Bld 260 (*)    All other components within normal limits  CBC  D-DIMER, QUANTITATIVE (NOT AT [facility])  BRAIN NATRIURETIC PEPTIDE  I-STAT TROPOININ, ED  I-STAT TROPOININ, ED  I-STAT TROPOININ, ED    EKG  EKG Interpretation  Date/Time:  Wednesday April 12 2016 10:26:46 EST Ventricular Rate:  96 PR Interval:  152 QRS Duration: 94 QT Interval:  348 QTC Calculation: 439 R Axis:   66 Text Interpretation:  Normal sinus rhythm Right atrial enlargement ST & T wave abnormality, consider inferior ischemia Abnormal ECG No significant change since last tracing Confirmed by Maximillian Habibi MD, DANIEL [id number]) on 04/12/2016 3:03:31 PM       Radiology  Dg Chest 2 View  Result Date: 04/12/2016 CLINICAL DATA:  Shortness of breath for 2 weeks.  Chest pain today. EXAM: CHEST  2 VIEW COMPARISON:  11/26/2014 FINDINGS: The cardiomediastinal silhouette is within normal limits. The lungs are hypoinflated with mild bronchovascular crowding. No confluent airspace opacity, overt pulmonary edema, pleural effusion, or pneumothorax is identified. S-shaped thoracolumbar scoliosis is again seen. IMPRESSION: No active cardiopulmonary disease. Electronically Signed   By: Logan Bores M.D.   On: 04/12/2016 10:52    Procedures Procedures (including critical care time)  Medications Ordered in ED Medications  gi cocktail (Maalox,Lidocaine,Donnatal) (not administered)     Initial Impression / Assessment and Plan / ED Course  I have reviewed the triage vital signs and the nursing notes.  Pertinent labs & imaging results that were available during my care of the patient were reviewed by me and considered in my medical decision making (see chart for details).     46 yo F With a chief complaint of chest pain and shortness of breath. This associated with a URI. I suspect this is the cause of her symptom pathology. She  had a delta troponin that was negative a d-dimer that was negative as well. These were all ordered by triage. Patient had a chest x-ray that was unremarkable. Labs are otherwise reassuring. Patient is well-appearing and nontoxic. Discharge home with PCP follow-up.  3:46 PM:  I have discussed the diagnosis/risks/treatment options with the patient and family and believe the pt to be eligible for discharge home to follow-up with PCP. We also discussed returning to the ED immediately if new or worsening sx occur. We discussed the sx which are most concerning (e.g., sudden worsening pain, fever, inability to tolerate by mouth) that necessitate immediate return. Medications administered to the patient during their visit and any new prescriptions provided to the patient are listed below.  Medications given during this visit Medications  gi cocktail (Maalox,Lidocaine,Donnatal) (not administered)     The patient appears reasonably screen and/or stabilized for discharge and I doubt any other medical condition or other [facility] requiring further screening, evaluation, or treatment in the ED at this time prior to discharge.    Final Clinical Impressions(s) / ED Diagnoses   Final diagnoses:  Shortness of breath    New Prescriptions New Prescriptions   No medications on file     Deno Etienne, DO 04/12/16 1546

## 2016-04-12 NOTE — ED Notes (Signed)
Pt ambulated to room from waiting room. Pt stated that she is exhausted after walking back to her room.

## 2016-04-12 NOTE — ED Triage Notes (Signed)
Pt reports SOB onset x 2 wks worsening 2-3 days ago, pt reports non radiating CP onset today, denies n/v/d, denies cardiac hx, reports being seen for symptoms x 2 by PCP for symptoms, A&O x4, speaks in complete sentences, no use of accessory muscles

## 2016-04-12 NOTE — ED Notes (Signed)
Before ambulating in hallway, pt's O2 read 100%. While ambulating in the hall to the restroom, pt's O2 read 100% tolerated well. [place] ambulating back to room from restroom, pt's O2 read 96%. Pt back in bed and on monitor, O2 reading 99%. Once back in bed, pt stated she "just feels tried and SOB." Nurse was notified.

## 2016-10-24 ENCOUNTER — Emergency Department (HOSPITAL_COMMUNITY)
Admission: EM | Admit: 2016-10-24 | Discharge: 2016-10-24 | Disposition: A | Discharge status: Home / Self Care | Payer: BLUE CROSS/BLUE SHIELD | Attending: Emergency Medicine | Admitting: Emergency Medicine

## 2016-10-24 ENCOUNTER — Encounter (HOSPITAL_COMMUNITY): Payer: Self-pay | Admitting: *Deleted

## 2016-10-24 DIAGNOSIS — F17210 Nicotine dependence, cigarettes, uncomplicated: Secondary | ICD-10-CM | POA: Insufficient documentation

## 2016-10-24 DIAGNOSIS — R51 Headache: Secondary | ICD-10-CM

## 2016-10-24 DIAGNOSIS — R519 Headache, unspecified: Secondary | ICD-10-CM

## 2016-10-24 DIAGNOSIS — Z79899 Other long term (current) drug therapy: Secondary | ICD-10-CM | POA: Insufficient documentation

## 2016-10-24 DIAGNOSIS — M419 Scoliosis, unspecified: Secondary | ICD-10-CM | POA: Insufficient documentation

## 2016-10-24 DIAGNOSIS — I10 Essential (primary) hypertension: Secondary | ICD-10-CM

## 2016-10-24 LAB — CBC WITH DIFFERENTIAL/PLATELET
BASOS ABS: 0.1 10*3/uL (ref 0.0–0.1)
Basophils Relative: 1 %
Eosinophils Absolute: 0.3 10*3/uL (ref 0.0–0.7)
Eosinophils Relative: 5 %
HEMATOCRIT: 41.7 % (ref 36.0–46.0)
Hemoglobin: 13.8 g/dL (ref 12.0–15.0)
LYMPHS PCT: 36 %
Lymphs Abs: 2 10*3/uL (ref 0.7–4.0)
MCH: 29.6 pg (ref 26.0–34.0)
MCHC: 33.1 g/dL (ref 30.0–36.0)
MCV: 89.5 fL (ref 78.0–100.0)
MONO ABS: 0.4 10*3/uL (ref 0.1–1.0)
Monocytes Relative: 7 %
NEUTROS ABS: 2.9 10*3/uL (ref 1.7–7.7)
Neutrophils Relative %: 51 %
Platelets: 281 10*3/uL (ref 150–400)
RBC: 4.66 MIL/uL (ref 3.87–5.11)
RDW: 14.1 % (ref 11.5–15.5)
WBC: 5.6 10*3/uL (ref 4.0–10.5)

## 2016-10-24 LAB — BASIC METABOLIC PANEL
ANION GAP: 9 (ref 5–15)
BUN: 14 mg/dL (ref 6–20)
CHLORIDE: 106 mmol/L (ref 101–111)
CO2: 22 mmol/L (ref 22–32)
Calcium: 9.3 mg/dL (ref 8.9–10.3)
Creatinine, Ser: 0.73 mg/dL (ref 0.44–1.00)
GFR calc Af Amer: >60 mL/min (ref >60)
GLUCOSE: 134 mg/dL — AB (ref 65–99)
POTASSIUM: 3.5 mmol/L (ref 3.5–5.1)
Sodium: 137 mmol/L (ref 135–145)

## 2016-10-24 MED ORDER — DIPHENHYDRAMINE HCL 50 MG/ML IJ SOLN
25.0000 mg | Freq: Once | INTRAMUSCULAR | Status: DC
  Filled 2016-10-24: qty 1

## 2016-10-24 MED ORDER — DIPHENHYDRAMINE HCL 25 MG PO CAPS
25.0000 mg | ORAL_CAPSULE | Freq: Once | ORAL | Status: AC
  Administered 2016-10-24: 25 mg via ORAL
  Filled 2016-10-24: qty 1

## 2016-10-24 MED ORDER — METOCLOPRAMIDE HCL 5 MG/ML IJ SOLN
10.0000 mg | Freq: Once | INTRAMUSCULAR | Status: AC
  Administered 2016-10-24: 10 mg via INTRAMUSCULAR

## 2016-10-24 MED ORDER — METOCLOPRAMIDE HCL 5 MG/ML IJ SOLN
10.0000 mg | Freq: Once | INTRAMUSCULAR | Status: DC
  Filled 2016-10-24: qty 2

## 2016-10-24 NOTE — ED Provider Notes (Signed)
[facility] DEPT Provider Note   CSN: [id number] Arrival date & time: 10/24/16  3614     History   Chief Complaint Chief Complaint  Patient presents with  . Hypertension    HPI [name] is a 46 y.o. female.  [name] is a 46 y.o. Female with a history of hypertension who presents to the ED complaining of a headache and high blood pressure. Patient reports she woke this morning with a generalized headache. She reports that she then took her blood pressure and reports it was 260/200. She reports she has a history of hypertension and recently restarted her blood pressure medication. She tells me she was out of her blood pressure medication for about 3 months, until she restarted her blood pressure medication yesterday morning with lisinopril and hydrochlorothiazide. This morning she woke up with this headache and took her blood pressure medications after checking her blood pressure noting it to be high. She tells me her blood pressure seems to be improving since being in the emergency department, however she still reports a generalized pounding headache. She denies any other complaints. She denies fevers, changes to her vision, numbness, tingling, weakness, chest pain, shortness of breath, abdominal pain, vomiting, diarrhea, rashes, lightheadedness, dizziness or syncope.   The history is provided by the patient and medical records. No language interpreter was used.  Hypertension  Associated symptoms include headaches. Pertinent negatives include no chest pain, no abdominal pain and no shortness of breath.    Past Medical History:  Diagnosis Date  . Back pain   . GERD (gastroesophageal reflux disease)   . High cholesterol   . Hypertension   . Migraine   . Scoliosis     Patient Active Problem List   Diagnosis Date Noted  . Migraine with aura 07/08/2013  . 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 ABDOMINAL  HYSTERECTOMY    . ABDOMINAL SURGERY    . CESAREAN SECTION      OB History    No data available       Home Medications    Prior to Admission medications   Medication Sig Start Date End Date Taking? Authorizing Provider  Aspirin-Acetaminophen-Caffeine (GOODY HEADACHE PO) Take 1 Package by mouth 3 (three) times daily as needed (headache).    [provider]  estradiol (VIVELLE-DOT) 0.05 MG/24HR patch Place 1 patch onto the skin 2 (two) times a week.    [provider]  hydrochlorothiazide (HYDRODIURIL) 25 MG tablet Take 1 tablet (25 mg total) by mouth 2 (two) times daily. Patient taking differently: Take 25 mg by mouth daily.  11/26/14   Carmin Muskrat, MD  levofloxacin (LEVAQUIN) 500 MG tablet Take 500 mg by mouth daily. 04/11/16   [provider]  lisinopril (PRINIVIL,ZESTRIL) 20 MG tablet Take 1 tablet (20 mg total) by mouth 2 (two) times daily. 11/26/14   Carmin Muskrat, MD  potassium chloride (MICRO-K) 10 MEQ CR capsule Take 10 mEq by mouth daily. 08/14/13   [provider]  predniSONE (DELTASONE) 10 MG tablet Take 10-40 mg by mouth See admin instructions. Take 4 tabs daily for 3 days, then reduce by one tab every 3 days until complete 04/05/16   [provider]  simvastatin (ZOCOR) 20 MG tablet Take 20 mg by mouth at bedtime.    [provider]  SUMAtriptan (IMITREX) 100 MG tablet Take 1 tablet (100 mg total) by mouth once as needed for migraine. May  repeat x 1 after 2 hours; maximum 2 tabs per day and 8 tabs per month Patient not taking: Reported on 11/26/2014 07/08/13   Penumalli, Earlean Polka, MD  topiramate (TOPAMAX) 25 MG tablet Take 25 mg by mouth 2 (two) times daily. 04/05/16   [provider]    Family History Family History  Problem Relation Age of Onset  . Hypertension Mother   . Stroke Mother   . Cancer Mother   . Diabetes Mother   . Heart attack Father     Social History Social History  Substance Use Topics   . Smoking status: Current Every Day Smoker    Packs/day: 1.00    Years: 15.00    Types: Cigarettes  . Smokeless tobacco: Never Used  . Alcohol use Yes     Comment: social     Allergies   Patient has no known allergies.   Review of Systems Review of Systems  Constitutional: Negative for chills and fever.  HENT: Negative for congestion and sore throat.   Eyes: Negative for visual disturbance.  Respiratory: Negative for cough, shortness of breath and wheezing.   Cardiovascular: Negative for chest pain and palpitations.  Gastrointestinal: Negative for abdominal pain, diarrhea, nausea and vomiting.  Genitourinary: Negative for dysuria.  Musculoskeletal: Negative for back pain and neck pain.  Skin: Negative for rash.  Neurological: Positive for headaches. Negative for dizziness, syncope, weakness, light-headedness and numbness.     Physical Exam Updated Vital Signs BP 127/79   Pulse (!) 59   Temp 98.6 F (37 C) (Oral)   Resp 19   Ht 5\' 5"  (1.651 m)   Wt 82.6 kg (182 lb)   SpO2 100%   BMI 30.29 kg/m   Physical Exam  Constitutional: She is oriented to person, place, and time. She appears well-developed and well-nourished. No distress.  Nontoxic appearing.  HENT:  Head: Normocephalic and atraumatic.  Mouth/Throat: Oropharynx is clear and moist.  Eyes: Pupils are equal, round, and reactive to light. Conjunctivae are normal. Right eye exhibits no discharge. Left eye exhibits no discharge.  Neck: Normal range of motion. Neck supple. No JVD present.  Cardiovascular: Normal rate, regular rhythm, normal heart sounds and intact distal pulses.  Exam reveals no gallop and no friction rub.   No murmur heard. Pulmonary/Chest: Effort normal and breath sounds normal. No stridor. No respiratory distress. She has no wheezes. She has no rales.  Lungs are clear to ascultation bilaterally. Symmetric chest expansion bilaterally. No increased work of breathing. No rales or rhonchi.      Abdominal: Soft. There is no tenderness.  Musculoskeletal: She exhibits no edema or tenderness.  No extremity edema or tenderness  Lymphadenopathy:    She has no cervical adenopathy.  Neurological: She is alert and oriented to person, place, and time. No cranial nerve deficit or sensory deficit. She exhibits normal muscle tone. Coordination normal.  Alert and oriented 3. Cranial nerves are intact. Speech is clear and coherent. No pronator drift. Finger-to-nose intact bilaterally. Sensation and strength is intact in her bilateral upper and lower extremities. Good strength with plantar and dorsiflexion bilaterally.   Skin: Skin is warm and dry. Capillary refill takes less than 2 seconds. No rash noted. She is not diaphoretic. No erythema. No pallor.  Psychiatric: She has a normal mood and affect. Her behavior is normal.  Nursing note and vitals reviewed.    ED Treatments / Results  Labs (all labs ordered are listed, but only abnormal results are displayed) Labs  Reviewed  BASIC METABOLIC PANEL - Abnormal; Notable for the following:       Result Value   Glucose, Bld 134 (*)    All other components within normal limits  CBC WITH DIFFERENTIAL/PLATELET    EKG  EKG Interpretation  Date/Time:  Tuesday October 24 2016 07:04:31 EDT Ventricular Rate:  72 PR Interval:    QRS Duration: 91 QT Interval:  412 QTC Calculation: 451 R Axis:   59 Text Interpretation:  Sinus rhythm Left atrial enlargement Baseline wander in lead(s) V6 Abnormal ekg Confirmed by Carmin Muskrat [phone]) on 10/24/2016 7:07:09 AM Also confirmed by Carmin Muskrat ([id number]), editor Hattie Perch ([id number])  on 10/24/2016 7:16:00 AM       Radiology No results found.  Procedures Procedures (including critical care time)  Medications Ordered in ED Medications  diphenhydrAMINE (BENADRYL) capsule 25 mg (25 mg Oral Given 10/24/16 0746)  metoCLOPramide (REGLAN) injection 10 mg (10 mg Intramuscular Given 10/24/16 0747)      Initial Impression / Assessment and Plan / ED Course  I have reviewed the triage vital signs and the nursing notes.  Pertinent labs & imaging results that were available during my care of the patient were reviewed by me and considered in my medical decision making (see chart for details).    This is a 46 y.o. Female with a history of hypertension who presents to the ED complaining of a headache and high blood pressure. Patient reports she woke this morning with a generalized headache. She reports that she then took her blood pressure and reports it was 260/200. She reports she has a history of hypertension and recently restarted her blood pressure medication. She tells me she was out of her blood pressure medication for about 3 months, until she restarted her blood pressure medication yesterday morning with lisinopril and hydrochlorothiazide. This morning she woke up with this headache and took her blood pressure medications after checking her blood pressure noting it to be high. She tells me her blood pressure seems to be improving since being in the emergency department, however she still reports a generalized pounding headache. She denies any other complaints. On examination patient is afebrile nontoxic appearing. She has no focal neurological deficits on exam.  Her initial blood pressure in triage was around 150/100. On my evaluation her blood pressure is 140/80. EKG shows no STEMI. BMP and CBC is unremarkable.  At reevaluation following headache cocktail patient reports her headache has resolved. Her blood pressure is 130/76 while I am in the room. Work up here is reassuring. I encouraged her to follow up closely with PCP for blood pressure recheck. She reports she does not need any refills on her blood pressure medications. She reports she will take her blood pressure medications at home as prescribed. Return precautions discussed.  I advised the patient to follow-up with their primary care  provider this week. I advised the patient to return to the emergency department with new or worsening symptoms or new concerns. The patient verbalized understanding and agreement with plan.      Final Clinical Impressions(s) / ED Diagnoses   Final diagnoses:  Essential hypertension  Bad headache    New Prescriptions New Prescriptions   No medications on file     Sharmaine Base 10/24/16 0920    Jola Schmidt, MD 10/24/16 [phone]

## 2016-10-24 NOTE — ED Triage Notes (Signed)
Pt has been feeling bad for the past couple of days. Reports getting up this morning with a headache and dizziness, checked her blood pressure and noted pressure to be 263/207. Pt ran out of one of her antihypertensive medications for the past 2 weeks and recently started back on it.

## 2016-12-28 ENCOUNTER — Encounter (HOSPITAL_COMMUNITY): Payer: Self-pay | Admitting: *Deleted

## 2016-12-28 ENCOUNTER — Emergency Department (HOSPITAL_COMMUNITY): Payer: Self-pay

## 2016-12-28 ENCOUNTER — Emergency Department (HOSPITAL_COMMUNITY)
Admission: EM | Admit: 2016-12-28 | Discharge: 2016-12-28 | Disposition: A | Discharge status: Home / Self Care | Payer: Self-pay | Attending: Emergency Medicine | Admitting: Emergency Medicine

## 2016-12-28 DIAGNOSIS — Z7982 Long term (current) use of aspirin: Secondary | ICD-10-CM | POA: Insufficient documentation

## 2016-12-28 DIAGNOSIS — Z79899 Other long term (current) drug therapy: Secondary | ICD-10-CM | POA: Insufficient documentation

## 2016-12-28 DIAGNOSIS — M5441 Lumbago with sciatica, right side: Secondary | ICD-10-CM

## 2016-12-28 DIAGNOSIS — I10 Essential (primary) hypertension: Secondary | ICD-10-CM | POA: Insufficient documentation

## 2016-12-28 DIAGNOSIS — R109 Unspecified abdominal pain: Secondary | ICD-10-CM | POA: Insufficient documentation

## 2016-12-28 DIAGNOSIS — F17210 Nicotine dependence, cigarettes, uncomplicated: Secondary | ICD-10-CM | POA: Insufficient documentation

## 2016-12-28 LAB — URINALYSIS, ROUTINE W REFLEX MICROSCOPIC
Bacteria, UA: NONE SEEN
Glucose, UA: NEGATIVE mg/dL
Hgb urine dipstick: NEGATIVE
Ketones, ur: 5 mg/dL — AB
Leukocytes, UA: NEGATIVE
Nitrite: NEGATIVE
Protein, ur: 30 mg/dL — AB
SPECIFIC GRAVITY, URINE: 1.041 — AB (ref 1.005–1.030)
pH: 5 (ref 5.0–8.0)

## 2016-12-28 LAB — CBC WITH DIFFERENTIAL/PLATELET
Basophils Absolute: 0.1 10*3/uL (ref 0.0–0.1)
Basophils Relative: 1 %
EOS ABS: 0.3 10*3/uL (ref 0.0–0.7)
Eosinophils Relative: 4 %
HEMATOCRIT: 41.9 % (ref 36.0–46.0)
HEMOGLOBIN: 14.2 g/dL (ref 12.0–15.0)
LYMPHS ABS: 3.7 10*3/uL (ref 0.7–4.0)
Lymphocytes Relative: 54 %
MCH: 30.3 pg (ref 26.0–34.0)
MCHC: 33.9 g/dL (ref 30.0–36.0)
MCV: 89.5 fL (ref 78.0–100.0)
MONOS PCT: 7 %
Monocytes Absolute: 0.5 10*3/uL (ref 0.1–1.0)
NEUTROS ABS: 2.4 10*3/uL (ref 1.7–7.7)
NEUTROS PCT: 34 %
Platelets: 292 10*3/uL (ref 150–400)
RBC: 4.68 MIL/uL (ref 3.87–5.11)
RDW: 14.1 % (ref 11.5–15.5)
WBC: 7 10*3/uL (ref 4.0–10.5)

## 2016-12-28 LAB — COMPREHENSIVE METABOLIC PANEL
ALBUMIN: 4.2 g/dL (ref 3.5–5.0)
ALT: 24 U/L (ref 14–54)
AST: 24 U/L (ref 15–41)
Alkaline Phosphatase: 94 U/L (ref 38–126)
Anion gap: 10 (ref 5–15)
BUN: 21 mg/dL — ABNORMAL HIGH (ref 6–20)
CHLORIDE: 109 mmol/L (ref 101–111)
CO2: 24 mmol/L (ref 22–32)
CREATININE: 0.77 mg/dL (ref 0.44–1.00)
Calcium: 10 mg/dL (ref 8.9–10.3)
GFR calc non Af Amer: >60 mL/min (ref >60)
Glucose, Bld: 103 mg/dL — ABNORMAL HIGH (ref 65–99)
Potassium: 3.7 mmol/L (ref 3.5–5.1)
SODIUM: 143 mmol/L (ref 135–145)
Total Bilirubin: 0.4 mg/dL (ref 0.3–1.2)
Total Protein: 8 g/dL (ref 6.5–8.1)

## 2016-12-28 LAB — CBG MONITORING, ED: GLUCOSE-CAPILLARY: 91 mg/dL (ref 65–99)

## 2016-12-28 LAB — BILIRUBIN, DIRECT: Bilirubin, Direct: <0.1 mg/dL — ABNORMAL LOW (ref 0.1–0.5)

## 2016-12-28 MED ORDER — KETOROLAC TROMETHAMINE 30 MG/ML IJ SOLN
30.0000 mg | Freq: Once | INTRAMUSCULAR | Status: AC
  Administered 2016-12-28: 30 mg via INTRAVENOUS
  Filled 2016-12-28: qty 1

## 2016-12-28 MED ORDER — IOPAMIDOL (ISOVUE-300) INJECTION 61%
100.0000 mL | Freq: Once | INTRAVENOUS | Status: AC | PRN
  Administered 2016-12-28: 100 mL via INTRAVENOUS

## 2016-12-28 MED ORDER — IOPAMIDOL (ISOVUE-300) INJECTION 61%
INTRAVENOUS | Status: AC
  Filled 2016-12-28: qty 100

## 2016-12-28 MED ORDER — HYDROCODONE-ACETAMINOPHEN 5-325 MG PO TABS: 2.0000 | ORAL_TABLET | ORAL | 0 refills | Status: DC | PRN

## 2016-12-28 MED ORDER — PREDNISONE 10 MG PO TABS: ORAL_TABLET | ORAL | 0 refills | Status: DC

## 2016-12-28 NOTE — ED Notes (Signed)
Gave pt 4 cups of water to drink as she states that she has not had anything since early today.  PA advised that she could drink

## 2016-12-28 NOTE — ED Notes (Signed)
Unsuccessful IV attempt x2. Charge RN asked to attempt.  

## 2016-12-28 NOTE — ED Notes (Signed)
Bed: WA03 Expected date:  Expected time:  Means of arrival:  Comments: Hold fast track room5?

## 2016-12-28 NOTE — ED Provider Notes (Signed)
[facility] DEPT Provider Note   CSN: [id number] Arrival date & time: 12/28/16  1148     History   Chief Complaint Chief Complaint  Patient presents with  . Leg Pain    HPI [name] is a 46 y.o. female.  HPI   Patient is a 46 year old female with a history of scoliosis, GERD, migraine, hypertension presenting for 3 weeks of bilateral posterior buttocks and thigh pain.  Patient reports that she has had some increasing pain bilaterally for 3 weeks but it was exacerbated by standing at the [facility] parade on Saturday.  Patient reports it hurts to stand to sit she cannot get comfortable.  Patient reports symptoms are worse on the right and radiate down her right lower extremity.  Patient denies any fever, chills, IV drug use, history of cancer, saddle anesthesia, loss of bowel or bladder control, muscular weakness, or numbness of the lower extremities.  She has had some bilateral paresthesias of the lower extremities, however worse on right.  Patient's been trying ibuprofen/Goody powder without full relief.  Patient has had some discomfort with urination.  Patient describes it as a cramping and it was resolved with urination a few days ago, however she is noting it more frequently now.  Patient has a history of hysterectomy. No vaginal discharge. Patient denies STI exposure to her knowledge.  Patient is a monogamous sexual relationship.   Past Medical History:  Diagnosis Date  . Back pain   . GERD (gastroesophageal reflux disease)   . High cholesterol   . Hypertension   . Migraine   . Scoliosis     Patient Active Problem List   Diagnosis Date Noted  . Migraine with aura 07/08/2013  . 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 ABDOMINAL HYSTERECTOMY    . ABDOMINAL SURGERY    . CESAREAN SECTION      OB History    No data available       Home Medications    Prior to Admission  medications   Medication Sig Start Date End Date Taking? Authorizing Provider  Aspirin-Acetaminophen-Caffeine (GOODY HEADACHE PO) Take 1 Package by mouth 3 (three) times daily as needed (headache).   Yes [provider]  estradiol (VIVELLE-DOT) 0.05 MG/24HR patch Place 1 patch onto the skin 2 (two) times a week.   Yes [provider]  hydrochlorothiazide (HYDRODIURIL) 25 MG tablet Take 1 tablet (25 mg total) by mouth 2 (two) times daily. Patient taking differently: Take 25 mg by mouth daily.  11/26/14  Yes Carmin Muskrat, MD  lisinopril (PRINIVIL,ZESTRIL) 20 MG tablet Take 1 tablet (20 mg total) by mouth 2 (two) times daily. 11/26/14  Yes Carmin Muskrat, MD  topiramate (TOPAMAX) 25 MG tablet Take 50 mg daily by mouth.  04/05/16  Yes [provider]  baclofen (LIORESAL) 10 MG tablet baclofen 10 mg tablet    [provider]  Bepotastine Besilate (BEPREVE) 1.5 % SOLN Bepreve 1.5 % eye drops    [provider]  KLOR-CON 10 10 MEQ tablet Take 10 mEq daily by mouth. 12/08/16   [provider]  levofloxacin (LEVAQUIN) 500 MG tablet Take 500 mg by mouth daily. 04/11/16   [provider]  lisinopril (PRINIVIL,ZESTRIL) 40 MG tablet lisinopril 40 mg tablet    [provider]  lisinopril (PRINIVIL,ZESTRIL) 40 MG tablet Take 80 mg daily by mouth. 12/08/16   [provider]  nicotine (NICODERM  CQ - DOSED IN MG/24 HOURS) 21 mg/24hr patch nicotine 21 mg/24 hr daily transdermal patch  APPLY 1 PATCH TO SKIN ONCE A DAY    [provider]  potassium chloride (MICRO-K) 10 MEQ CR capsule Take 10 mEq by mouth daily. 08/14/13   [provider]  predniSONE (DELTASONE) 10 MG tablet Take 10-40 mg by mouth See admin instructions. Take 4 tabs daily for 3 days, then reduce by one tab every 3 days until complete 04/05/16   [provider]  simvastatin (ZOCOR) 20 MG tablet Take 20 mg by mouth at bedtime.    [provider]  simvastatin (ZOCOR) 40 MG tablet simvastatin 40 mg tablet    [provider]  SUMAtriptan (IMITREX) 100 MG tablet Take 1 tablet (100 mg total) by mouth once as needed for migraine. May repeat x 1 after 2 hours; maximum 2 tabs per day and 8 tabs per month Patient not taking: Reported on 11/26/2014 07/08/13   Penumalli, Earlean Polka, MD  topiramate (TOPAMAX) 100 MG tablet topiramate 100 mg tablet    [provider]  topiramate (TOPAMAX) 25 MG tablet topiramate 25 mg tablet    [provider]  topiramate (TOPAMAX) 50 MG tablet topiramate 50 mg tablet    [provider]  zonisamide (ZONEGRAN) 25 MG capsule zonisamide 25 mg capsule    [provider]    Family History Family History  Problem Relation Age of Onset  . Hypertension Mother   . Stroke Mother   . Cancer Mother   . Diabetes Mother   . Heart attack Father     Social History Social History   Tobacco Use  . Smoking status: Current Every Day Smoker    Packs/day: 1.00    Years: 15.00    Pack years: 15.00    Types: Cigarettes  . Smokeless tobacco: Never Used  Substance Use Topics  . Alcohol use: Yes    Comment: social  . Drug use: No     Allergies   Patient has no known allergies.   Review of Systems Review of Systems  Constitutional: Negative for chills and fever.  Genitourinary: Positive for dysuria. Negative for pelvic pain.  Musculoskeletal: Positive for arthralgias and myalgias.  Neurological: Negative for weakness and numbness.     Physical Exam Updated Vital Signs BP 121/79 (BP Location: Right Arm)   Pulse 63   Temp 98 F (36.7 C) (Oral)   Resp 18   SpO2 100%   Physical Exam  Constitutional: She appears well-developed and well-nourished. No distress.  HENT:  Head: Normocephalic and atraumatic.  Mouth/Throat: Oropharynx is clear and moist.  Eyes: Conjunctivae and EOM are normal. Pupils are equal, round, and reactive to light.  Neck: Normal  range of motion. Neck supple.  Cardiovascular: Normal rate, regular rhythm, S1 normal and S2 normal.  No murmur heard. Pulmonary/Chest: Effort normal and breath sounds normal. She has no wheezes. She has no rales.  Abdominal: Soft. She exhibits no distension. There is no tenderness. There is no guarding.  No focal tenderness over lower abdomen. Discomfort to palpation of suprapubic region.  Musculoskeletal: Normal range of motion. She exhibits no edema or deformity.  Spine Exam: Inspection/Palpation: No midline tenderness to palpation of cervical, thoracic, or lumbar spine.  Patient is tender over bilateral sacroiliac joints.  No tenderness to palpation of ischial bursa.  This exam was performed under chaperone present.  Tenderness to palpation over greater trochanters bilaterally. Strength: 5/5 throughout LE bilaterally (hip  flexion/extension, adduction/abduction; knee flexion/extension; foot dorsiflexion/plantarflexion, inversion/eversion; great toe inversion) Sensation: Intact to light touch in proximal and distal LE bilaterally Reflexes: No hyperreflexia demonstrated. Gait is symmetric and coordinated.  Patient demonstrates pain with walking.  Lymphadenopathy:    She has no cervical adenopathy.  Neurological: She is alert.  Cranial nerves grossly intact.  Skin: Skin is warm and dry. No rash noted. No erythema.  Psychiatric: She has a normal mood and affect. Her behavior is normal. Judgment and thought content normal.  Nursing note and vitals reviewed.    ED Treatments / Results  Labs (all labs ordered are listed, but only abnormal results are displayed) Labs Reviewed  URINALYSIS, ROUTINE W REFLEX MICROSCOPIC - Abnormal; Notable for the following components:      Result Value   Color, Urine AMBER (*)    APPearance HAZY (*)    Specific Gravity, Urine 1.041 (*)    Bilirubin Urine MODERATE (*)    Ketones, ur 5 (*)    Protein, ur 30 (*)    Squamous Epithelial / LPF 0-5 (*)    All  other components within normal limits  COMPREHENSIVE METABOLIC PANEL - Abnormal; Notable for the following components:   Glucose, Bld 103 (*)    BUN 21 (*)    All other components within normal limits  CBC WITH DIFFERENTIAL/PLATELET  BILIRUBIN, DIRECT  CBG MONITORING, ED    EKG  EKG Interpretation None       Radiology No results found.  Procedures Procedures (including critical care time)  Medications Ordered in ED Medications  iopamidol (ISOVUE-300) 61 % injection (not administered)  ketorolac (TORADOL) 30 MG/ML injection 30 mg (30 mg Intravenous Given 12/28/16 1551)     Initial Impression / Assessment and Plan / ED Course  I have reviewed the triage vital signs and the nursing notes.  Pertinent labs & imaging results that were available during my care of the patient were reviewed by me and considered in my medical decision making (see chart for details).      Final Clinical Impressions(s) / ED Diagnoses   Final diagnoses:  None   At present, believe that patient's bilateral hip pain is likely sciatica.  Patient administered Toradol.  Last creatinine 1 month ago was within normal limits.  Patient is expressing some lower abdominal pain with urination.  Given the lumbosacra discomfort, obtained UA.  UA demonstrated moderate bilirubin and proteinuria.  Proteinuria is new for patient. Patient has had bilirubin in her urine prior.  Urinalysis also demonstrated high specific gravity and ketones.  This is likely due to dehydration.  Patient demonstrated no jaundice or scleral icterus.  Patient reports using Goody powder but not Tylenol.  Patient reports she uses Goody powder daily.  I discussed these results with patient, and she wished to proceed with CMP testing due to poor follow-up as she does not have a PCP presently.  Additionally, and we have, patient was expressing increasing lower abdominal discomfort and cramping.  Patient was deemed to require a higher level of care  and the case was discussed with Alyse Low, PA-C, who accepted the patient in the acute section of the emergency department.  Report given.  Patient case discussed with Dr. Addison Lank.   ED Discharge Orders    None       Tamala Julian 12/28/16 1813    Fatima Blank, MD 01/02/17 [phone]

## 2016-12-28 NOTE — ED Triage Notes (Signed)
Pt complains of pain in bilateral buttocks for the past week. Pt noticed the pain while walking.

## 2016-12-28 NOTE — ED Provider Notes (Signed)
Pt complains of pain in lower abdomen and in her low back.  Pt reports pain goes down her right leg.  Pt reports she has had for approx 3 weeks,   Diffusely tender lower abdominal area, no point tenderness, no peritoneal signs,   Tender right lower back and right sciatic area,  Pain with movement.   Ct shows normal abdomen.  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  Pt has degenerative changes and foraminal narrowing at L5/S1.  This is most likely cause of pain.   I will treat ptr with Prednisone taper and pain medication.   She is advised to follow up with Dr. Erlinda Hong for evaluation.  Current Meds  Medication Sig  . Aspirin-Acetaminophen-Caffeine (GOODY HEADACHE PO) Take 1 Package by mouth 3 (three) times daily as needed (headache).  . estradiol (VIVELLE-DOT) 0.05 MG/24HR patch Place 1 patch onto the skin 2 (two) times a week.  . hydrochlorothiazide (HYDRODIURIL) 25 MG tablet Take 1 tablet (25 mg total) by mouth 2 (two) times daily. (Patient taking differently: Take 25 mg by mouth daily. )  . topiramate (TOPAMAX) 25 MG tablet Take 50 mg daily by mouth.   . [DISCONTINUED] lisinopril (PRINIVIL,ZESTRIL) 20 MG tablet Take 1 tablet (20 mg total) by mouth 2 (two) times daily.   An After Visit Summary was printed and given to the patient.    [name] 12/28/16 Damien Fusi, MD 12/28/16 2147

## 2017-02-23 ENCOUNTER — Ambulatory Visit (INDEPENDENT_AMBULATORY_CARE_PROVIDER_SITE_OTHER): Payer: BLUE CROSS/BLUE SHIELD | Admitting: Orthopaedic Surgery

## 2017-02-23 ENCOUNTER — Encounter (INDEPENDENT_AMBULATORY_CARE_PROVIDER_SITE_OTHER): Payer: Self-pay | Admitting: Orthopaedic Surgery

## 2017-02-23 ENCOUNTER — Ambulatory Visit (INDEPENDENT_AMBULATORY_CARE_PROVIDER_SITE_OTHER): Payer: BLUE CROSS/BLUE SHIELD

## 2017-02-23 DIAGNOSIS — M545 Low back pain, unspecified: Secondary | ICD-10-CM

## 2017-02-23 DIAGNOSIS — G8929 Other chronic pain: Secondary | ICD-10-CM

## 2017-02-23 MED ORDER — DICLOFENAC SODIUM 75 MG PO TBEC: 75.0000 mg | DELAYED_RELEASE_TABLET | Freq: Two times a day (BID) | ORAL | 2 refills | Status: DC

## 2017-02-23 MED ORDER — PREDNISONE 10 MG (21) PO TBPK
ORAL_TABLET | ORAL | 0 refills | Status: DC
Start: 2017-02-23 — End: 2019-09-19

## 2017-02-23 MED ORDER — TIZANIDINE HCL 4 MG PO TABS: 4.0000 mg | ORAL_TABLET | Freq: Four times a day (QID) | ORAL | 2 refills | Status: DC | PRN

## 2017-02-23 NOTE — Progress Notes (Signed)
Office Visit Note   Patient: [name]           Date of Birth: 07-26-1970           MRN: [id number] Visit Date: 02/23/2017              Requested by: No referring provider defined for this encounter. PCP: System, Pcp Not In   Assessment & Plan: Visit Diagnoses:  1. Chronic low back pain without sciatica, unspecified back pain laterality     Plan: Impression is axial low back pain, lumbar spondylosis.  Prescription for physical therapy to work on core strengthening hamstring flexibility.  Prescription for prednisone taper, diclofenac, Zanaflex.  Questions encouraged and answered.  Follow-up as needed.  Follow-Up Instructions: Return if symptoms worsen or fail to improve.   Orders:  Orders Placed This Encounter  Procedures  . XR Lumbar Spine 2-3 Views   Meds ordered this encounter  Medications  . predniSONE (STERAPRED UNI-PAK 21 TAB) 10 MG (21) TBPK tablet    Sig: Take as directed    Dispense:  21 tablet    Refill:  0  . diclofenac (VOLTAREN) 75 MG EC tablet    Sig: Take 1 tablet (75 mg total) by mouth 2 (two) times daily.    Dispense:  30 tablet    Refill:  2  . tiZANidine (ZANAFLEX) 4 MG tablet    Sig: Take 1 tablet (4 mg total) by mouth every 6 (six) hours as needed for muscle spasms.    Dispense:  30 tablet    Refill:  2      Procedures: No procedures performed   Clinical Data: No additional findings.   Subjective: Chief Complaint  Patient presents with  . Lower Back - Pain    Patient is a 47 year old female who comes in with 3-month history of low back pain without any significant radicular symptoms.  She denies any numbness and tingling.  Her buttocks hurt all the time.  She denies any groin pain.  She works as a Glass blower/designer.  She denies any injuries.  The pain is worse with sitting    Review of Systems  Constitutional: Negative.   HENT: Negative.   Eyes: Negative.   Respiratory: Negative.   Cardiovascular: Negative.   Endocrine:  Negative.   Musculoskeletal: Negative.   Neurological: Negative.   Hematological: Negative.   Psychiatric/Behavioral: Negative.   All other systems reviewed and are negative.    Objective: Vital Signs: There were no vitals taken for this visit.  Physical Exam  Constitutional: She is oriented to person, place, and time. She appears well-developed and well-nourished.  HENT:  Head: Normocephalic and atraumatic.  Eyes: EOM are normal.  Neck: Neck supple.  Pulmonary/Chest: Effort normal.  Abdominal: Soft.  Neurological: She is alert and oriented to person, place, and time.  Skin: Skin is warm. Capillary refill takes less than 2 seconds.  Psychiatric: She has a normal mood and affect. Her behavior is normal. Judgment and thought content normal.  Nursing note and vitals reviewed.   Ortho Exam Lumbar spine exam shows no palpable defects.  She has no focal motor or sensory deficits.  Normal reflexes.  No pathologic signs.  Painless rotation of the hips. Specialty Comments:  No specialty comments available.  Imaging: Xr Lumbar Spine 2-3 Views  Result Date: 02/23/2017 Significant lumbar spondylosis and facet disease.  Mild scoliosis of the thoracolumbar junction    PMFS History: Patient Active Problem List   Diagnosis Date  Noted  . Migraine with aura 07/08/2013  . 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 Back pain   . GERD (gastroesophageal reflux disease)   . High cholesterol   . Hypertension   . Migraine   . Scoliosis     Family History  Problem Relation Age of Onset  . Hypertension Mother   . Stroke Mother   . Cancer Mother   . Diabetes Mother   . Heart attack Father     Past Surgical History:  Procedure Laterality Date  . ABDOMINAL HYSTERECTOMY    . ABDOMINAL SURGERY    . CESAREAN SECTION     Social History   Occupational History  . Occupation: Teacher, adult education: [employer]    Comment: [employer]    Tobacco Use  . Smoking status: Current Every Day Smoker    Packs/day: 1.00    Years: 15.00    Pack years: 15.00    Types: Cigarettes  . Smokeless tobacco: Never Used  Substance and Sexual Activity  . Alcohol use: Yes    Comment: social  . Drug use: No  . Sexual activity: Not on file

## 2017-08-14 ENCOUNTER — Ambulatory Visit: Payer: BLUE CROSS/BLUE SHIELD | Admitting: Registered"

## 2017-09-06 ENCOUNTER — Other Ambulatory Visit (INDEPENDENT_AMBULATORY_CARE_PROVIDER_SITE_OTHER): Payer: Self-pay | Admitting: Orthopaedic Surgery

## 2017-09-06 NOTE — Telephone Encounter (Signed)
Do you wish to refilll

## 2019-02-06 ENCOUNTER — Other Ambulatory Visit: Payer: Self-pay

## 2019-02-06 DIAGNOSIS — Z20822 Contact with and (suspected) exposure to covid-19: Secondary | ICD-10-CM

## 2019-02-07 LAB — NOVEL CORONAVIRUS, NAA: SARS-CoV-2, NAA: DETECTED — AB

## 2019-02-08 ENCOUNTER — Telehealth (HOSPITAL_COMMUNITY): Payer: Self-pay | Admitting: Critical Care Medicine

## 2019-02-08 ENCOUNTER — Other Ambulatory Visit (HOSPITAL_COMMUNITY): Payer: Self-pay | Admitting: Critical Care Medicine

## 2019-02-08 DIAGNOSIS — U071 COVID-19: Secondary | ICD-10-CM

## 2019-02-08 DIAGNOSIS — E1165 Type 2 diabetes mellitus with hyperglycemia: Secondary | ICD-10-CM

## 2019-02-08 NOTE — Progress Notes (Signed)
  I connected by phone with [name] on 02/08/2019 at 2:19 PM to discuss the potential use of an new treatment for mild to moderate COVID-19 viral infection in non-hospitalized patients.  This patient is a 48 y.o. female that meets the FDA criteria for Emergency Use Authorization of bamlanivimab or casirivimab\imdevimab.  Has a (+) direct SARS-CoV-2 viral test result  Has mild or moderate COVID-19   Is ? 50 years of age and weighs ? 40 kg  Is NOT hospitalized due to COVID-19  Is NOT requiring oxygen therapy or requiring an increase in baseline oxygen flow rate due to COVID-19  Is within 10 days of symptom onset  Has at least one of the high risk factor(s) for progression to severe COVID-19 and/or hospitalization as defined in EUA.  Specific high risk criteria : Diabetes   I have spoken and communicated the following to the patient or parent/caregiver:  1. FDA has authorized the emergency use of bamlanivimab and casirivimab\imdevimab for the treatment of mild to moderate COVID-19 in adults and pediatric patients with positive results of direct SARS-CoV-2 viral testing who are 74 years of age and older weighing at least 40 kg, and who are at high risk for progressing to severe COVID-19 and/or hospitalization.  2. The significant known and potential risks and benefits of bamlanivimab and casirivimab\imdevimab, and the extent to which such potential risks and benefits are unknown.  3. Information on available alternative treatments and the risks and benefits of those alternatives, including clinical trials.  4. Patients treated with bamlanivimab and casirivimab\imdevimab should continue to self-isolate and use infection control measures (e.g., wear mask, isolate, social distance, avoid sharing personal items, clean and disinfect "high touch" surfaces, and frequent handwashing) according to CDC guidelines.   5. The patient or parent/caregiver has the option to accept or refuse  bamlanivimab or casirivimab\imdevimab .  After reviewing this information with the patient, The patient agreed to proceed with receiving the bamlanimivab infusion and will be provided a copy of the Fact sheet prior to receiving the infusion.Asencion Noble 02/08/2019 2:19 PM

## 2019-02-08 NOTE — Telephone Encounter (Signed)
I connected with this patient who is Covid positive.  She was tested December 17 and she is having significant symptoms of mild shortness of breath cough headache fever fatigue.  She needs criteria for monoclonal antibody and that she has type 2 diabetes.  Called to discuss with patient about Covid symptoms and the use of bamlanivimab, a monoclonal antibody infusion for those with mild to moderate Covid symptoms and at a high risk of hospitalization.  Pt is qualified for this infusion at the [facility] infusion center due to Diabetes

## 2019-02-11 ENCOUNTER — Ambulatory Visit (HOSPITAL_COMMUNITY)
Admission: RE | Admit: 2019-02-11 | Discharge: 2019-02-11 | Disposition: A | Discharge status: Home / Self Care | Payer: BLUE CROSS/BLUE SHIELD | Source: Ambulatory Visit | Attending: Pediatrics | Admitting: Pediatrics

## 2019-02-11 DIAGNOSIS — U071 COVID-19: Secondary | ICD-10-CM | POA: Insufficient documentation

## 2019-02-11 DIAGNOSIS — E1165 Type 2 diabetes mellitus with hyperglycemia: Secondary | ICD-10-CM | POA: Diagnosis present

## 2019-02-11 MED ORDER — METHYLPREDNISOLONE SODIUM SUCC 125 MG IJ SOLR: 125.0000 mg | Freq: Once | INTRAMUSCULAR | Status: DC | PRN

## 2019-02-11 MED ORDER — SODIUM CHLORIDE 0.9 % IV SOLN
INTRAVENOUS | Status: DC | PRN
  Administered 2019-02-11: 09:00:00 250 mL via INTRAVENOUS

## 2019-02-11 MED ORDER — DIPHENHYDRAMINE HCL 50 MG/ML IJ SOLN: 50.0000 mg | Freq: Once | INTRAMUSCULAR | Status: DC | PRN

## 2019-02-11 MED ORDER — EPINEPHRINE 0.3 MG/0.3ML IJ SOAJ: 0.3000 mg | Freq: Once | INTRAMUSCULAR | Status: DC | PRN

## 2019-02-11 MED ORDER — SODIUM CHLORIDE 0.9 % IV SOLN
700.0000 mg | Freq: Once | INTRAVENOUS | Status: AC
  Administered 2019-02-11: 700 mg via INTRAVENOUS
  Filled 2019-02-11: qty 20

## 2019-02-11 MED ORDER — FAMOTIDINE IN NACL 20-0.9 MG/50ML-% IV SOLN: 20.0000 mg | Freq: Once | INTRAVENOUS | Status: DC | PRN

## 2019-02-11 MED ORDER — ALBUTEROL SULFATE HFA 108 (90 BASE) MCG/ACT IN AERS: 2.0000 | INHALATION_SPRAY | Freq: Once | RESPIRATORY_TRACT | Status: DC | PRN

## 2019-02-11 NOTE — Progress Notes (Signed)
Patient ID: [name], female   DOB: March 01, 1970, 48 y.o.   MRN: [id number]    Diagnosis: COVID-19  Physician: Mannam  Procedure: Covid Infusion Clinic Med: bamlanivimab infusion - Provided patient with bamlanimivab fact sheet for patients, parents and caregivers prior to infusion.  Complications: No immediate complications noted.  Discharge: Discharged home   Estrella Deeds 02/11/2019

## 2019-08-21 ENCOUNTER — Other Ambulatory Visit: Payer: Self-pay

## 2019-08-22 ENCOUNTER — Ambulatory Visit: Payer: Self-pay | Admitting: Family Medicine

## 2019-08-22 ENCOUNTER — Encounter: Payer: Self-pay | Admitting: Family Medicine

## 2019-08-22 VITALS — BP 132/84 | Temp 97.7°F | Ht 63.0 in | Wt 175.2 lb

## 2019-08-22 DIAGNOSIS — Z720 Tobacco use: Secondary | ICD-10-CM

## 2019-08-22 DIAGNOSIS — E7800 Pure hypercholesterolemia, unspecified: Secondary | ICD-10-CM

## 2019-08-22 DIAGNOSIS — N3289 Other specified disorders of bladder: Secondary | ICD-10-CM

## 2019-08-22 DIAGNOSIS — R7303 Prediabetes: Secondary | ICD-10-CM

## 2019-08-22 DIAGNOSIS — I10 Essential (primary) hypertension: Secondary | ICD-10-CM | POA: Insufficient documentation

## 2019-08-22 DIAGNOSIS — G43109 Migraine with aura, not intractable, without status migrainosus: Secondary | ICD-10-CM

## 2019-08-22 MED ORDER — TOPIRAMATE 50 MG PO TABS: 50.0000 mg | ORAL_TABLET | Freq: Two times a day (BID) | ORAL | 1 refills | Status: DC

## 2019-08-22 MED ORDER — CHLORTHALIDONE 25 MG PO TABS: 25.0000 mg | ORAL_TABLET | Freq: Every day | ORAL | 1 refills | Status: DC

## 2019-08-22 MED ORDER — ROSUVASTATIN CALCIUM 10 MG PO TABS: 10.0000 mg | ORAL_TABLET | Freq: Every day | ORAL | 2 refills | Status: DC

## 2019-08-22 MED ORDER — LISINOPRIL 40 MG PO TABS: 40.0000 mg | ORAL_TABLET | Freq: Every day | ORAL | 1 refills | Status: DC

## 2019-08-22 MED ORDER — OXYBUTYNIN CHLORIDE 5 MG/5ML PO SYRP: 5.0000 mg | ORAL_SOLUTION | Freq: Two times a day (BID) | ORAL | 1 refills | Status: DC

## 2019-08-22 NOTE — Progress Notes (Signed)
New Patient Office Visit  Subjective:  Patient ID: [name], female    DOB: 1970-03-22  Age: 49 y.o. MRN: [id number]  CC:  Chief Complaint  Patient presents with  . Establish Care    New patient, needing refill on medications     HPI [name] presents for follow-up of hypertension, elevated cholesterol, prediabetes, irritable bladder syndrome and history of migraine headaches.  Currently taking lisinopril 40 twice daily with HCTZ.  Taking rosuvastatin for her elevated cholesterol.  Taking 100 mg of Topamax once daily for migraine prevention which has been working for her.  Takes Ditropan for irritable bladder.  This is been quite helpful to her.  She smokes 6 cigarettes a day.  She rarely drinks alcohol.  Past Medical History:  Diagnosis Date  . Back pain   . GERD (gastroesophageal reflux disease)   . High cholesterol   . Hypertension   . Migraine   . Scoliosis     Past Surgical History:  Procedure Laterality Date  . ABDOMINAL HYSTERECTOMY    . ABDOMINAL SURGERY    . CESAREAN SECTION    . HAND SURGERY      Family History  Problem Relation Age of Onset  . Hypertension Mother   . Stroke Mother   . Cancer Mother   . Diabetes Mother   . Heart attack Father     Social History   Socioeconomic History  . Marital status: Married    Spouse name: Lucious  . Number of children: 2  . Years of education: 12th  . Highest education level: Not on file  Occupational History  . 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 Smoking status: Current Every Day Smoker    Packs/day: 1.00    Years: 15.00    Pack years: 15.00    Types: Cigarettes  . Smokeless tobacco: Never Used  Vaping Use  . Vaping Use: Never used  Substance and Sexual Activity  . Alcohol use: Yes    Comment: social  . Drug use: No  . Sexual activity: Not on file  Other Topics Concern  . Not on file  Social History Narrative   Patient lives at home  with her family.   Caffeine use:7-8 cups daily (tea)   Social Determinants of Health   Financial Resource Strain:   . Difficulty of Paying Living Expenses:   Food Insecurity:   . Worried About Charity fundraiser in the Last Year:   . Arboriculturist in the Last Year:   Transportation Needs:   . Film/video editor (Medical):   Marland Kitchen Lack of Transportation (Non-Medical):   Physical Activity:   . Days of Exercise per Week:   . Minutes of Exercise per Session:   Stress:   . Feeling of Stress :   Social Connections:   . Frequency of Communication with Friends and Family:   . Frequency of Social Gatherings with Friends and Family:   . Attends Religious Services:   . Active Member of Clubs or Organizations:   . Attends Archivist Meetings:   Marland Kitchen Marital Status:   Intimate Partner Violence:   . Fear of Current or Ex-Partner:   . Emotionally Abused:   Marland Kitchen Physically Abused:   . Sexually Abused:     ROS Review of Systems  Constitutional: Negative.   HENT: Negative.   Eyes: Negative for photophobia and visual disturbance.  Respiratory: Negative.   Cardiovascular: Negative.   Gastrointestinal: Negative.   Endocrine: Negative for polyphagia and polyuria.  Musculoskeletal: Negative for gait problem and joint swelling.  Skin: Negative for pallor and rash.  Allergic/Immunologic: Negative for immunocompromised state.  Neurological: Negative for dizziness, speech difficulty and light-headedness.   Depression screen [facility] 2/9 08/22/2019 08/22/2019  Decreased Interest 0 0  Down, Depressed, Hopeless 0 0  PHQ - 2 Score 0 0  Altered sleeping 0 -  Tired, decreased energy 3 -  Change in appetite 0 -  Feeling bad or failure about yourself  0 -  Trouble concentrating 0 -  Moving slowly or fidgety/restless 0 -  Suicidal thoughts 0 -  PHQ-9 Score 3 -  Difficult doing work/chores Not difficult at all -    Objective:   Today's Vitals: BP 132/84   Temp 97.7 F (36.5 C) (Tympanic)    Ht 5\' 3"  (1.6 m)   Wt 175 lb 3.2 oz (79.5 kg)   BMI 31.04 kg/m   Physical Exam Vitals and nursing note reviewed.  Constitutional:      General: She is not in acute distress.    Appearance: Normal appearance. She is not ill-appearing, toxic-appearing or diaphoretic.  HENT:     Head: Normocephalic and atraumatic.     Right Ear: External ear normal.     Left Ear: External ear normal.     Nose: Nose normal.     Mouth/Throat:     Mouth: Mucous membranes are dry.     Pharynx: Oropharynx is clear. No oropharyngeal exudate or posterior oropharyngeal erythema.  Eyes:     General: No scleral icterus.       Right eye: No discharge.        Left eye: No discharge.     Extraocular Movements: Extraocular movements intact.     Conjunctiva/sclera: Conjunctivae normal.  Cardiovascular:     Rate and Rhythm: Normal rate and regular rhythm.  Pulmonary:     Effort: Pulmonary effort is normal.     Breath sounds: Normal breath sounds.  Musculoskeletal:     Cervical back: No rigidity or tenderness.     Right lower leg: No edema.     Left lower leg: No edema.  Lymphadenopathy:     Cervical: No cervical adenopathy.  Skin:    General: Skin is warm and dry.  Neurological:     Mental Status: She is alert and oriented to person, place, and time.  Psychiatric:        Mood and Affect: Mood normal.        Behavior: Behavior normal.     Assessment & Plan:   Problem List Items Addressed This Visit      Cardiovascular and Mediastinum   Migraine with aura and without status migrainosus, not intractable   Relevant Medications   rosuvastatin (CRESTOR) 10 MG tablet   chlorthalidone (HYGROTON) 25 MG tablet   lisinopril (ZESTRIL) 40 MG tablet   topiramate (TOPAMAX) 50 MG tablet   Essential hypertension - Primary   Relevant Medications   rosuvastatin (CRESTOR) 10 MG tablet   chlorthalidone (HYGROTON) 25 MG tablet   lisinopril (ZESTRIL) 40 MG tablet   Other Relevant Orders   CBC   Comprehensive  metabolic panel   Urinalysis, Routine w reflex microscopic   Microalbumin / creatinine urine ratio   TSH     Other   Prediabetes   Relevant Medications   lisinopril (ZESTRIL) 40 MG tablet   Other Relevant Orders  LDL cholesterol, direct   Hemoglobin A1c   Urinalysis, Routine w reflex microscopic   Microalbumin / creatinine urine ratio   Elevated cholesterol   Relevant Medications   rosuvastatin (CRESTOR) 10 MG tablet   chlorthalidone (HYGROTON) 25 MG tablet   lisinopril (ZESTRIL) 40 MG tablet   Other Relevant Orders   Comprehensive metabolic panel   Lipid panel   Tobacco use    Other Visit Diagnoses    Irritable bladder       Relevant Medications   oxybutynin (DITROPAN) 5 MG/5ML syrup      Outpatient Encounter Medications as of 08/22/2019  Medication Sig  . oxybutynin (DITROPAN) 5 MG/5ML syrup Take 5 mLs (5 mg total) by mouth 2 (two) times daily.  . rosuvastatin (CRESTOR) 10 MG tablet Take 1 tablet (10 mg total) by mouth daily.  . [DISCONTINUED] Elastic Bandages & Supports (CARPAL TUNNEL WRIST STABILIZER) MISC by Does not apply route.  . [DISCONTINUED] hydrochlorothiazide (HYDRODIURIL) 25 MG tablet Take 1 tablet (25 mg total) by mouth 2 (two) times daily. (Patient taking differently: Take 25 mg by mouth daily. )  . [DISCONTINUED] lisinopril (PRINIVIL,ZESTRIL) 40 MG tablet lisinopril 40 mg tablet  . [DISCONTINUED] oxybutynin (DITROPAN) 5 MG/5ML syrup Take 5 mg by mouth 2 (two) times daily.  . [DISCONTINUED] rosuvastatin (CRESTOR) 10 MG tablet Take 10 mg by mouth daily.  . [DISCONTINUED] topiramate (TOPAMAX) 100 MG tablet topiramate 100 mg tablet  . chlorthalidone (HYGROTON) 25 MG tablet Take 1 tablet (25 mg total) by mouth daily.  Marland Kitchen lisinopril (ZESTRIL) 40 MG tablet Take 1 tablet (40 mg total) by mouth daily.  . predniSONE (DELTASONE) 10 MG tablet 6,5,4,3,2,1 taper (Patient not taking: Reported on 08/22/2019)  . predniSONE (STERAPRED UNI-PAK 21 TAB) 10 MG (21) TBPK tablet Take  as directed (Patient not taking: Reported on 08/22/2019)  . simvastatin (ZOCOR) 40 MG tablet simvastatin 40 mg tablet (Patient not taking: Reported on 08/22/2019)  . tiZANidine (ZANAFLEX) 4 MG tablet Take 1 tablet (4 mg total) by mouth every 6 (six) hours as needed for muscle spasms. (Patient not taking: Reported on 08/22/2019)  . topiramate (TOPAMAX) 50 MG tablet Take 1 tablet (50 mg total) by mouth 2 (two) times daily.  . [DISCONTINUED] Aspirin-Acetaminophen-Caffeine (GOODY HEADACHE PO) Take 1 Package by mouth 3 (three) times daily as needed (headache). (Patient not taking: Reported on 08/22/2019)  . [DISCONTINUED] baclofen (LIORESAL) 10 MG tablet baclofen 10 mg tablet (Patient not taking: Reported on 08/22/2019)  . [DISCONTINUED] Bepotastine Besilate (BEPREVE) 1.5 % SOLN Bepreve 1.5 % eye drops (Patient not taking: Reported on 08/22/2019)  . [DISCONTINUED] diclofenac (VOLTAREN) 75 MG EC tablet TAKE 1 TABLET BY MOUTH TWICE A DAY (Patient not taking: Reported on 08/22/2019)  . [DISCONTINUED] estradiol (VIVELLE-DOT) 0.05 MG/24HR patch Place 1 patch onto the skin 2 (two) times a week. (Patient not taking: Reported on 08/22/2019)  . [DISCONTINUED] HYDROcodone-acetaminophen (NORCO/VICODIN) 5-325 MG tablet Take 2 tablets every 4 (four) hours as needed by mouth. (Patient not taking: Reported on 08/22/2019)  . [DISCONTINUED] KLOR-CON 10 10 MEQ tablet Take 10 mEq daily by mouth. (Patient not taking: Reported on 08/22/2019)  . [DISCONTINUED] lisinopril (PRINIVIL,ZESTRIL) 40 MG tablet Take 80 mg daily by mouth. (Patient not taking: Reported on 08/22/2019)  . [DISCONTINUED] lisinopril-hydrochlorothiazide (PRINZIDE,ZESTORETIC) 20-12.5 MG per tablet Take 2 tablets by mouth daily.  . [DISCONTINUED] nicotine (NICODERM CQ - DOSED IN MG/24 HOURS) 21 mg/24hr patch nicotine 21 mg/24 hr daily transdermal patch  APPLY 1 PATCH TO SKIN ONCE A  DAY (Patient not taking: Reported on 08/22/2019)  . [DISCONTINUED] topiramate (TOPAMAX) 25 MG tablet Take  50 mg daily by mouth.  (Patient not taking: Reported on 08/22/2019)  . [DISCONTINUED] topiramate (TOPAMAX) 25 MG tablet topiramate 25 mg tablet (Patient not taking: Reported on 08/22/2019)  . [DISCONTINUED] topiramate (TOPAMAX) 50 MG tablet topiramate 50 mg tablet (Patient not taking: Reported on 08/22/2019)  . [DISCONTINUED] zonisamide (ZONEGRAN) 25 MG capsule zonisamide 25 mg capsule (Patient not taking: Reported on 08/22/2019)   No facility-administered encounter medications on file as of 08/22/2019.    Follow-up: Return in about 5 weeks (around 09/26/2019).   Patient will take lisinopril 40 daily and chlorthalidone 25 mg daily.  Changed Topamax to 50 mg twice daily.  Continue to Ditropan.  Continue rosuvastatin.  Advised smoking cessation.  We will follow-up fasting for above ordered blood work.  Libby Maw, MD

## 2019-08-26 ENCOUNTER — Other Ambulatory Visit (INDEPENDENT_AMBULATORY_CARE_PROVIDER_SITE_OTHER): Payer: Self-pay

## 2019-08-26 ENCOUNTER — Other Ambulatory Visit: Payer: Self-pay

## 2019-08-26 DIAGNOSIS — I10 Essential (primary) hypertension: Secondary | ICD-10-CM

## 2019-08-26 DIAGNOSIS — E7800 Pure hypercholesterolemia, unspecified: Secondary | ICD-10-CM

## 2019-08-26 DIAGNOSIS — R7303 Prediabetes: Secondary | ICD-10-CM

## 2019-08-27 LAB — LDL CHOLESTEROL, DIRECT: Direct LDL: 102 mg/dL

## 2019-08-27 LAB — COMPREHENSIVE METABOLIC PANEL
ALT: 17 U/L (ref 0–35)
AST: 23 U/L (ref 0–37)
Albumin: 4.6 g/dL (ref 3.5–5.2)
Alkaline Phosphatase: 86 U/L (ref 39–117)
BUN: 20 mg/dL (ref 6–23)
CO2: 22 mEq/L (ref 19–32)
Calcium: 10.1 mg/dL (ref 8.4–10.5)
Chloride: 106 mEq/L (ref 96–112)
Creatinine, Ser: 0.73 mg/dL (ref 0.40–1.20)
GFR: 101.53 mL/min (ref >60.00)
Glucose, Bld: 96 mg/dL (ref 70–99)
Potassium: 3.4 mEq/L — ABNORMAL LOW (ref 3.5–5.1)
Sodium: 140 mEq/L (ref 135–145)
Total Bilirubin: 0.6 mg/dL (ref 0.2–1.2)
Total Protein: 7.5 g/dL (ref 6.0–8.3)

## 2019-08-27 LAB — URINALYSIS, ROUTINE W REFLEX MICROSCOPIC
Bilirubin Urine: NEGATIVE
Hgb urine dipstick: NEGATIVE
Ketones, ur: NEGATIVE
Leukocytes,Ua: NEGATIVE
Nitrite: NEGATIVE
RBC / HPF: NONE SEEN (ref 0–?)
Specific Gravity, Urine: 1.025 (ref 1.000–1.030)
Total Protein, Urine: NEGATIVE
Urine Glucose: NEGATIVE
Urobilinogen, UA: 0.2 (ref 0.0–1.0)
pH: 6 (ref 5.0–8.0)

## 2019-08-27 LAB — CBC
HCT: 42.9 % (ref 36.0–46.0)
Hemoglobin: 14.6 g/dL (ref 12.0–15.0)
MCHC: 33.9 g/dL (ref 30.0–36.0)
MCV: 91.5 fl (ref 78.0–100.0)
Platelets: 307 10*3/uL (ref 150.0–400.0)
RBC: 4.69 Mil/uL (ref 3.87–5.11)
RDW: 14.1 % (ref 11.5–15.5)
WBC: 6.3 10*3/uL (ref 4.0–10.5)

## 2019-08-27 LAB — MICROALBUMIN / CREATININE URINE RATIO
Creatinine,U: 175.3 mg/dL
Microalb Creat Ratio: 1.3 mg/g (ref 0.0–30.0)
Microalb, Ur: 2.3 mg/dL — ABNORMAL HIGH (ref 0.0–1.9)

## 2019-08-27 LAB — LIPID PANEL
Cholesterol: 167 mg/dL (ref 0–200)
HDL: 44.1 mg/dL (ref >39.00)
LDL Cholesterol: 105 mg/dL — ABNORMAL HIGH (ref 0–99)
NonHDL: 123.16
Total CHOL/HDL Ratio: 4
Triglycerides: 93 mg/dL (ref 0.0–149.0)
VLDL: 18.6 mg/dL (ref 0.0–40.0)

## 2019-08-27 LAB — TSH: TSH: 0.45 u[IU]/mL (ref 0.35–4.50)

## 2019-08-27 LAB — HEMOGLOBIN A1C: Hgb A1c MFr Bld: 6.6 % — ABNORMAL HIGH (ref 4.6–6.5)

## 2019-08-29 ENCOUNTER — Telehealth: Payer: Self-pay | Admitting: Family Medicine

## 2019-08-29 NOTE — Telephone Encounter (Signed)
Patient is returning the call. CB is [phone]

## 2019-09-08 ENCOUNTER — Telehealth: Payer: Self-pay | Admitting: Family Medicine

## 2019-09-08 DIAGNOSIS — N3289 Other specified disorders of bladder: Secondary | ICD-10-CM

## 2019-09-08 NOTE — Telephone Encounter (Signed)
Patient is calling and stated that Dr. Ethelene Hal changed her oxybutynin to a syrup instead of a pill. Patient stated that medication is causing her to constantly to go to the bathroom and wanted to see if medication be changed to something else, please advise.

## 2019-09-09 NOTE — Telephone Encounter (Signed)
Patient is calling back previous message left. Patient stated that she may not be able to answer and it is ok to lvm. CB is [phone]

## 2019-09-11 NOTE — Telephone Encounter (Signed)
Patient is calling back regarding her previous message. She states that this medication is affecting her at work. Please give her a call back at [phone].

## 2019-09-12 NOTE — Telephone Encounter (Signed)
Please advise message below   Patient is calling and stated that Dr. Ethelene Hal changed her oxybutynin to a syrup instead of a pill. Patient stated that medication is causing her to constantly to go to the bathroom and wanted to see if medication be changed to something else, please advise

## 2019-09-19 ENCOUNTER — Telehealth: Payer: Self-pay | Admitting: Family Medicine

## 2019-09-19 MED ORDER — OXYBUTYNIN CHLORIDE 5 MG PO TABS: 5.0000 mg | ORAL_TABLET | Freq: Two times a day (BID) | ORAL | 3 refills | Status: DC

## 2019-09-19 NOTE — Telephone Encounter (Signed)
Will send Rx for pill now and d/c liquid

## 2019-09-19 NOTE — Telephone Encounter (Signed)
Pt reports that she is frequently urinating and that the pills had no side effects.

## 2019-09-19 NOTE — Telephone Encounter (Signed)
Please see message and advise.  Thank you. ° °

## 2019-09-19 NOTE — Telephone Encounter (Signed)
Patient would like return call regarding her recent A1c results. Sending to Doc of the day.

## 2019-09-19 NOTE — Addendum Note (Signed)
Addended by: Ronnald Nian on: 09/19/2019 04:15 PM   Modules accepted: Orders

## 2019-09-19 NOTE — Telephone Encounter (Signed)
Is she referring to urinating frequently or having frequent BMs? Did side effect start when med was switched from pill to liquid? If she was on same med previously but in pill form without side effect, I will send Rx for same med in pill form. Or has she had this side effect since first starting the med?

## 2019-09-19 NOTE — Telephone Encounter (Signed)
Pt had multiple labs done on 08/26/19. Her A1C increased from 6.1 to 6.6. Dr. Ethelene Hal will be able to reach out to pt to discuss results in detail and any necessary medication additions/adjustments when he returns to the office next week.

## 2019-09-19 NOTE — Telephone Encounter (Signed)
Patient is calling again about this message regarding her oxybutinyn. She states she has not received an answer or resolution for this problem yet. Forwarding to Doc of the Day.

## 2019-09-19 NOTE — Telephone Encounter (Signed)
Pt informed of message.

## 2019-09-22 NOTE — Telephone Encounter (Signed)
We will discuss this at her appointment on 8/10.

## 2019-09-25 NOTE — Telephone Encounter (Signed)
LVM informing patient to call office

## 2019-09-26 NOTE — Telephone Encounter (Signed)
Pt aware.

## 2019-09-29 ENCOUNTER — Other Ambulatory Visit: Payer: Self-pay

## 2019-09-30 ENCOUNTER — Ambulatory Visit (INDEPENDENT_AMBULATORY_CARE_PROVIDER_SITE_OTHER): Payer: 59 | Admitting: Family Medicine

## 2019-09-30 ENCOUNTER — Encounter: Payer: Self-pay | Admitting: Family Medicine

## 2019-09-30 VITALS — BP 116/68 | HR 72 | Temp 98.1°F | Ht 63.0 in | Wt 169.6 lb

## 2019-09-30 DIAGNOSIS — N3289 Other specified disorders of bladder: Secondary | ICD-10-CM

## 2019-09-30 MED ORDER — OXYBUTYNIN CHLORIDE ER 5 MG PO TB24: 5.0000 mg | ORAL_TABLET | Freq: Every day | ORAL | 1 refills | Status: DC

## 2019-09-30 NOTE — Progress Notes (Signed)
Established Patient Office Visit  Subjective:  Patient ID: [name], female    DOB: 1971-01-02  Age: 49 y.o. MRN: [id number]  CC:  Chief Complaint  Patient presents with  . Follow-up    follow up patient still having concerns about urine frequentcy patient would like to start back on medication that she was oringina;lly on     HPI DEAMBER BUCKHALTER presents for follow-up of urinary frequency.  Frequency seems to become worse after starting the Ditropan twice daily.  She has been taking some form of Ditropan only once daily and that had been working for her.  She was switched from HCTZ to chlorthalidone for better blood pressure control.  She has been taking the chlorthalidone and has not noticed urinary frequency after taking it.  She has only taking the Zestril once daily.  Blood pressures well controlled with this change.  Past Medical History:  Diagnosis Date  . Back pain   . GERD (gastroesophageal reflux disease)   . High cholesterol   . Hypertension   . Migraine   . Scoliosis     Past Surgical History:  Procedure Laterality Date  . ABDOMINAL HYSTERECTOMY    . ABDOMINAL SURGERY    . CESAREAN SECTION    . HAND SURGERY      Family History  Problem Relation Age of Onset  . Hypertension Mother   . Stroke Mother   . Cancer Mother   . Diabetes Mother   . Heart attack Father     Social History   Socioeconomic History  . Marital status: Married    Spouse name: Lucious  . Number of children: 2  . Years of education: 12th  . Highest education level: Not on file  Occupational History  . 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 Smoking status: Current Every Day Smoker    Packs/day: 1.00    Years: 15.00    Pack years: 15.00    Types: Cigarettes  . Smokeless tobacco: Never Used  Vaping Use  . Vaping Use: Never used  Substance and Sexual Activity  . Alcohol use: Yes    Comment: rarely  . Drug use: No  . Sexual  activity: Not on file  Other Topics Concern  . Not on file  Social History Narrative   Patient lives at home with her family.   Caffeine use:7-8 cups daily (tea)   Social Determinants of Health   Financial Resource Strain:   . Difficulty of Paying Living Expenses:   Food Insecurity:   . Worried About Charity fundraiser in the Last Year:   . Arboriculturist in the Last Year:   Transportation Needs:   . Film/video editor (Medical):   Marland Kitchen Lack of Transportation (Non-Medical):   Physical Activity:   . Days of Exercise per Week:   . Minutes of Exercise per Session:   Stress:   . Feeling of Stress :   Social Connections:   . Frequency of Communication with Friends and Family:   . Frequency of Social Gatherings with Friends and Family:   . Attends Religious Services:   . Active Member of Clubs or Organizations:   . Attends Archivist Meetings:   Marland Kitchen Marital Status:   Intimate Partner Violence:   . Fear of Current or Ex-Partner:   . Emotionally Abused:   Marland Kitchen Physically Abused:   . Sexually Abused:  Outpatient Medications Prior to Visit  Medication Sig Dispense Refill  . chlorthalidone (HYGROTON) 25 MG tablet Take 1 tablet (25 mg total) by mouth daily. 90 tablet 1  . lisinopril (ZESTRIL) 40 MG tablet Take 1 tablet (40 mg total) by mouth daily. 90 tablet 1  . rosuvastatin (CRESTOR) 10 MG tablet Take 1 tablet (10 mg total) by mouth daily. 90 tablet 2  . topiramate (TOPAMAX) 50 MG tablet Take 1 tablet (50 mg total) by mouth 2 (two) times daily. 180 tablet 1  . oxybutynin (DITROPAN-XL) 5 MG 24 hr tablet Take 5 mg by mouth daily.    Marland Kitchen oxybutynin (DITROPAN) 5 MG tablet Take 1 tablet (5 mg total) by mouth 2 (two) times daily. 180 tablet 3   No facility-administered medications prior to visit.    No Known Allergies  ROS Review of Systems  Constitutional: Negative.   Respiratory: Negative.   Cardiovascular: Negative.   Gastrointestinal: Negative.   Genitourinary:  Positive for frequency. Negative for dysuria and urgency.  Musculoskeletal: Negative.   Skin: Negative.   Neurological: Negative for headaches.  Psychiatric/Behavioral: Negative.       Objective:    Physical Exam Constitutional:      General: She is not in acute distress.    Appearance: Normal appearance. She is not ill-appearing, toxic-appearing or diaphoretic.  HENT:     Head: Normocephalic.  Eyes:     General: No scleral icterus.       Right eye: No discharge.        Left eye: No discharge.     Conjunctiva/sclera: Conjunctivae normal.  Pulmonary:     Effort: Pulmonary effort is normal.  Neurological:     Mental Status: She is alert and oriented to person, place, and time.  Psychiatric:        Mood and Affect: Mood normal.        Behavior: Behavior normal.     BP 116/68   Pulse 72   Temp 98.1 F (36.7 C) (Tympanic)   Ht 5\' 3"  (1.6 m)   Wt 169 lb 9.6 oz (76.9 kg)   SpO2 100%   BMI 30.04 kg/m  Wt Readings from Last 3 Encounters:  09/30/19 169 lb 9.6 oz (76.9 kg)  08/22/19 175 lb 3.2 oz (79.5 kg)  10/24/16 182 lb (82.6 kg)     Health Maintenance Due  Topic Date Due  . Hepatitis C Screening  Never done  . HIV Screening  Never done  . PAP SMEAR-Modifier  Never done  . MAMMOGRAM  03/26/2018  . COLONOSCOPY  Never done  . INFLUENZA VACCINE  09/21/2019    There are no preventive care reminders to display for this patient.  Lab Results  Component Value Date   TSH 0.45 08/26/2019   Lab Results  Component Value Date   WBC 6.3 08/26/2019   HGB 14.6 08/26/2019   HCT 42.9 08/26/2019   MCV 91.5 08/26/2019   PLT 307.0 08/26/2019   Lab Results  Component Value Date   NA 140 08/26/2019   K 3.4 (L) 08/26/2019   CO2 22 08/26/2019   GLUCOSE 96 08/26/2019   BUN 20 08/26/2019   CREATININE 0.73 08/26/2019   BILITOT 0.6 08/26/2019   ALKPHOS 86 08/26/2019   AST 23 08/26/2019   ALT 17 08/26/2019   PROT 7.5 08/26/2019   ALBUMIN 4.6 08/26/2019   CALCIUM 10.1  08/26/2019   ANIONGAP 10 12/28/2016   GFR 101.53 08/26/2019   Lab Results  Component Value  Date   CHOL 167 08/26/2019   Lab Results  Component Value Date   HDL 44.10 08/26/2019   Lab Results  Component Value Date   LDLCALC 105 (H) 08/26/2019   Lab Results  Component Value Date   TRIG 93.0 08/26/2019   Lab Results  Component Value Date   CHOLHDL 4 08/26/2019   Lab Results  Component Value Date   HGBA1C 6.6 (H) 08/26/2019      Assessment & Plan:   Problem List Items Addressed This Visit    None    Visit Diagnoses    Irritable bladder    -  Primary   Relevant Medications   oxybutynin (DITROPAN XL) 5 MG 24 hr tablet   Other Relevant Orders   Urinalysis, Routine w reflex microscopic      Meds ordered this encounter  Medications  . oxybutynin (DITROPAN XL) 5 MG 24 hr tablet    Sig: Take 1 tablet (5 mg total) by mouth at bedtime.    Dispense:  30 tablet    Refill:  1    Follow-up: Return in about 1 month (around 10/31/2019), or have changed you to the extended release form of ditropan to be taken at night.. Continue Zestril 40 mg and chlorthalidone 25 mg daily.  Hopefully the XL form will relieve her symptoms.  May need to change back to the HCTZ if it does not.  Will need to recheck her potassium and discuss her hemoglobin A1c.   Libby Maw, MD

## 2019-10-01 LAB — URINALYSIS, ROUTINE W REFLEX MICROSCOPIC
Bilirubin Urine: NEGATIVE
Hgb urine dipstick: NEGATIVE
Ketones, ur: NEGATIVE
Leukocytes,Ua: NEGATIVE
Nitrite: NEGATIVE
RBC / HPF: NONE SEEN (ref 0–?)
Specific Gravity, Urine: 1.02 (ref 1.000–1.030)
Total Protein, Urine: NEGATIVE
Urine Glucose: NEGATIVE
Urobilinogen, UA: 1 (ref 0.0–1.0)
pH: 7 (ref 5.0–8.0)

## 2019-10-07 ENCOUNTER — Other Ambulatory Visit: Payer: Self-pay

## 2019-10-08 ENCOUNTER — Emergency Department (HOSPITAL_COMMUNITY): Payer: 59

## 2019-10-08 ENCOUNTER — Encounter: Payer: Self-pay | Admitting: Family Medicine

## 2019-10-08 ENCOUNTER — Other Ambulatory Visit: Payer: Self-pay

## 2019-10-08 ENCOUNTER — Ambulatory Visit (INDEPENDENT_AMBULATORY_CARE_PROVIDER_SITE_OTHER): Payer: 59 | Admitting: Family Medicine

## 2019-10-08 ENCOUNTER — Encounter (HOSPITAL_COMMUNITY): Payer: Self-pay | Admitting: *Deleted

## 2019-10-08 ENCOUNTER — Emergency Department (HOSPITAL_COMMUNITY)
Admission: EM | Admit: 2019-10-08 | Discharge: 2019-10-08 | Disposition: A | Discharge status: Home / Self Care | Payer: 59 | Attending: Emergency Medicine | Admitting: Emergency Medicine

## 2019-10-08 VITALS — BP 118/72 | HR 71 | Temp 98.0°F | Ht 63.0 in | Wt 169.8 lb

## 2019-10-08 DIAGNOSIS — F17210 Nicotine dependence, cigarettes, uncomplicated: Secondary | ICD-10-CM | POA: Diagnosis not present

## 2019-10-08 DIAGNOSIS — I10 Essential (primary) hypertension: Secondary | ICD-10-CM | POA: Insufficient documentation

## 2019-10-08 DIAGNOSIS — K219 Gastro-esophageal reflux disease without esophagitis: Secondary | ICD-10-CM | POA: Diagnosis not present

## 2019-10-08 DIAGNOSIS — Z7982 Long term (current) use of aspirin: Secondary | ICD-10-CM | POA: Diagnosis not present

## 2019-10-08 DIAGNOSIS — Z79899 Other long term (current) drug therapy: Secondary | ICD-10-CM | POA: Insufficient documentation

## 2019-10-08 DIAGNOSIS — R1031 Right lower quadrant pain: Secondary | ICD-10-CM | POA: Insufficient documentation

## 2019-10-08 DIAGNOSIS — R109 Unspecified abdominal pain: Secondary | ICD-10-CM | POA: Diagnosis present

## 2019-10-08 LAB — CBC
HCT: 43.6 % (ref 36.0–46.0)
Hemoglobin: 14.2 g/dL (ref 12.0–15.0)
MCH: 29.5 pg (ref 26.0–34.0)
MCHC: 32.6 g/dL (ref 30.0–36.0)
MCV: 90.5 fL (ref 80.0–100.0)
Platelets: 264 10*3/uL (ref 150–400)
RBC: 4.82 MIL/uL (ref 3.87–5.11)
RDW: 13.6 % (ref 11.5–15.5)
WBC: 4.9 10*3/uL (ref 4.0–10.5)
nRBC: 0 % (ref 0.0–0.2)

## 2019-10-08 LAB — COMPREHENSIVE METABOLIC PANEL
ALT: 15 U/L (ref 0–44)
AST: 19 U/L (ref 15–41)
Albumin: 3.8 g/dL (ref 3.5–5.0)
Alkaline Phosphatase: 70 U/L (ref 38–126)
Anion gap: 13 (ref 5–15)
BUN: 14 mg/dL (ref 6–20)
CO2: 22 mmol/L (ref 22–32)
Calcium: 9.8 mg/dL (ref 8.9–10.3)
Chloride: 106 mmol/L (ref 98–111)
Creatinine, Ser: 0.66 mg/dL (ref 0.44–1.00)
GFR calc Af Amer: >60 mL/min (ref >60)
GFR calc non Af Amer: >60 mL/min (ref >60)
Glucose, Bld: 102 mg/dL — ABNORMAL HIGH (ref 70–99)
Potassium: 3.1 mmol/L — ABNORMAL LOW (ref 3.5–5.1)
Sodium: 141 mmol/L (ref 135–145)
Total Bilirubin: 0.7 mg/dL (ref 0.3–1.2)
Total Protein: 7 g/dL (ref 6.5–8.1)

## 2019-10-08 LAB — URINALYSIS, ROUTINE W REFLEX MICROSCOPIC
Bilirubin Urine: NEGATIVE
Glucose, UA: NEGATIVE mg/dL
Hgb urine dipstick: NEGATIVE
Ketones, ur: NEGATIVE mg/dL
Leukocytes,Ua: NEGATIVE
Nitrite: NEGATIVE
Protein, ur: NEGATIVE mg/dL
Specific Gravity, Urine: 1.004 — ABNORMAL LOW (ref 1.005–1.030)
pH: 5 (ref 5.0–8.0)

## 2019-10-08 LAB — I-STAT BETA HCG BLOOD, ED (MC, WL, AP ONLY): I-stat hCG, quantitative: 6.9 m[IU]/mL — ABNORMAL HIGH (ref <5)

## 2019-10-08 LAB — LIPASE, BLOOD: Lipase: 38 U/L (ref 11–51)

## 2019-10-08 MED ORDER — SODIUM CHLORIDE 0.9 % IV BOLUS
1000.0000 mL | Freq: Once | INTRAVENOUS | Status: AC
  Administered 2019-10-08: 1000 mL via INTRAVENOUS

## 2019-10-08 MED ORDER — ONDANSETRON HCL 4 MG/2ML IJ SOLN
4.0000 mg | Freq: Once | INTRAMUSCULAR | Status: AC
  Administered 2019-10-08: 4 mg via INTRAVENOUS
  Filled 2019-10-08: qty 2

## 2019-10-08 MED ORDER — MORPHINE SULFATE (PF) 4 MG/ML IV SOLN
4.0000 mg | Freq: Once | INTRAVENOUS | Status: AC
  Administered 2019-10-08: 4 mg via INTRAVENOUS
  Filled 2019-10-08: qty 1

## 2019-10-08 MED ORDER — DICYCLOMINE HCL 20 MG PO TABS: 20.0000 mg | ORAL_TABLET | Freq: Two times a day (BID) | ORAL | 0 refills | Status: DC | PRN

## 2019-10-08 MED ORDER — ONDANSETRON HCL 4 MG PO TABS: 4.0000 mg | ORAL_TABLET | Freq: Three times a day (TID) | ORAL | 0 refills | Status: DC | PRN

## 2019-10-08 MED ORDER — IOHEXOL 350 MG/ML SOLN
100.0000 mL | Freq: Once | INTRAVENOUS | Status: AC | PRN
  Administered 2019-10-08: 100 mL via INTRAVENOUS

## 2019-10-08 NOTE — Patient Instructions (Signed)
Appendicitis, Adult  The appendix is a tube in the body that is shaped like a finger. It is attached to the large intestine. Appendicitis means that this tube is swollen (inflamed). If this is not treated, the tube can tear (rupture). This can lead to a life-threatening infection. This condition can also cause pus to build up in the appendix (abscess). What are the causes? This condition may be caused by something that blocks the appendix. These include:  A ball of poop (stool).  Lymph glands that are bigger than normal. Sometimes the cause is not known. What increases the risk? You are more likely to develop this condition if you are between 10 and 30 years of age. What are the signs or symptoms? Symptoms of this condition include:  Pain around the belly button (navel). ? The pain moves toward the lower right belly (abdomen). ? The pain can get worse with time. ? The pain can get worse if you cough. ? The pain can get worse if you move suddenly.  Tenderness in the lower right belly.  Feeling sick to your stomach (nauseous).  Throwing up (vomiting).  Not feeling hungry (loss of appetite).  A fever.  Having trouble pooping (constipation).  Watery poop (diarrhea).  Not feeling well. How is this treated? Most often, this condition is treated by taking out the appendix (appendectomy). There are two ways to do this:  Open surgery. For this method, the appendix is taken out through a large cut (incision). The cut is made in the lower right belly. This surgery may be used if: ? You have scars from another surgery. ? You have a bleeding condition. ? You are pregnant and will be having your baby soon. ? You have a condition that makes it hard to do the other type of surgery.  Laparoscopic surgery. For this method, the appendix is taken out through small cuts. Often, this surgery: ? Causes less pain. ? Causes fewer problems. ? Is easier to heal from. If your appendix tears and  pus forms:  A drain may be put into the sore. The drain will be used to get rid of the pus.  You may get an antibiotic medicine through an IV line.  Your appendix may or may not need to be taken out. Follow these instructions at home: If you had surgery, follow instructions from your doctor on how to care for yourself at home and how to take care of your cut from surgery. Medicines  Take over-the-counter and prescription medicines only as told by your doctor.  If you were prescribed an antibiotic medicine, take it as told by your doctor. Do not stop taking the antibiotic even if you start to feel better. Eating and drinking Follow instructions from your doctor about what you cannot eat or drink. You may go back to your diet slowly if:  You no longer feel sick to your stomach.  You have stopped throwing up. General instructions  Do not use any products that contain nicotine or tobacco, such as cigarettes, e-cigarettes, and chewing tobacco. If you need help quitting, ask your doctor.  Do not drive or use heavy machinery while taking prescription pain medicine.  Ask your doctor if the medicine you are taking can cause trouble pooping. You may need to take steps to prevent or treat trouble pooping: ? Drink enough fluid to keep your pee (urine) pale yellow. ? Take over-the-counter or prescription medicines. ? Eat foods that are high in fiber. These include beans,   whole grains, and fresh fruits and vegetables. ? Limit foods that are high in fat and sugar. These include fried or sweet foods.  Keep all follow-up visits as told by your doctor. This is important. Contact a doctor if:  There is pus, blood, or a lot of fluid coming from your cut or cuts from surgery.  You are sick to your stomach or you throw up. Get help right away if:  You have pain in your belly, and the pain is getting worse.  You have a fever.  You have chills.  You are very tired.  You have muscle  pain.  You are short of breath. Summary  Appendicitis is swelling of the appendix. The appendix is a tube that is shaped like a finger. It is joined to the large intestine.  This condition may be caused by something that blocks the appendix. This can lead to an infection.  This condition is usually treated by taking out the appendix. This information is not intended to replace advice given to you by your health care provider. Make sure you discuss any questions you have with your health care provider. Document Revised: 07/25/2017 Document Reviewed: 07/25/2017 Elsevier Patient Education  2020 Elsevier Inc.  

## 2019-10-08 NOTE — ED Notes (Signed)
Iv fluid almost complete

## 2019-10-08 NOTE — ED Triage Notes (Signed)
Pt is here with right lower abdominal pain since yesterday and reports she is nauseated.  No vomiting.  Sent from PCP to rule out appendicitis

## 2019-10-08 NOTE — ED Notes (Signed)
Pt returned from xray

## 2019-10-08 NOTE — ED Notes (Signed)
Pain "better"

## 2019-10-08 NOTE — Discharge Instructions (Addendum)
The cause of your pain was not identified today. You may take Tylenol or ibuprofen, available over-the-counter according to label instructions as needed for pain. Get rechecked if you develop fevers or worsening symptoms.   You had a CT scan performed in the emergency department today. The scan did shows findings of atherosclerosis as well as diverticulosis. Please follow-up with your family doctor for recheck.

## 2019-10-08 NOTE — ED Provider Notes (Signed)
[facility] EMERGENCY DEPARTMENT Provider Note   CSN: [id number] Arrival date & time: 10/08/19  0940     History Chief Complaint  Patient presents with  . Abdominal Pain    [name] is a 49 y.o. female.  The history is provided by the patient and medical records. No language interpreter was used.  Abdominal Pain  [name] is a 49 y.o. female who presents to the Emergency Department complaining of abdominal pain. She presents the emergency department complaining of right lower quadrant abdominal pain that began yesterday morning when she awoke. Pain is described as sharp and crampy in nature. She has associated nausea and numerous episodes of emesis. She also reports increased urinary output but no dysuria. No diarrhea, no fevers. Pain is worse with moving, coughing and activity. No prior similar symptoms. She went to her PCPs office today and was referred to the emergency department for further evaluation.    Past Medical History:  Diagnosis Date  . Back pain   . GERD (gastroesophageal reflux disease)   . High cholesterol   . Hypertension   . Migraine   . Scoliosis     Patient Active Problem List   Diagnosis Date Noted  . Right lower quadrant abdominal pain 10/08/2019  . Prediabetes 08/22/2019  . Elevated cholesterol 08/22/2019  . Essential hypertension 08/22/2019  . Tobacco use 08/22/2019  . Migraine with aura and without status migrainosus, not intractable 07/08/2013  . 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 ABDOMINAL HYSTERECTOMY    . ABDOMINAL SURGERY    . CESAREAN SECTION    . HAND SURGERY       OB History   No obstetric history on file.     Family History  Problem Relation Age of Onset  . Hypertension Mother   . Stroke Mother   . Cancer Mother   . Diabetes Mother   . Heart attack Father     Social History   Tobacco Use  . Smoking status: Current Every Day Smoker      Packs/day: 1.00    Years: 15.00    Pack years: 15.00    Types: Cigarettes  . Smokeless tobacco: Never Used  Vaping Use  . Vaping Use: Never used  Substance Use Topics  . Alcohol use: Yes    Comment: rarely  . Drug use: No    Home Medications Prior to Admission medications   Medication Sig Start Date End Date Taking? Authorizing Provider  Aspirin-Caffeine (BC FAST PAIN RELIEF) 845-65 MG PACK Take 2 packets by mouth daily as needed (pain).   Yes [provider]  chlorthalidone (HYGROTON) 25 MG tablet Take 1 tablet (25 mg total) by mouth daily. 08/22/19  Yes Libby Maw, MD  lisinopril (ZESTRIL) 40 MG tablet Take 1 tablet (40 mg total) by mouth daily. 08/22/19  Yes Libby Maw, MD  oxybutynin (DITROPAN XL) 5 MG 24 hr tablet Take 1 tablet (5 mg total) by mouth at bedtime. 09/30/19  Yes Libby Maw, MD  Potassium (POTASSIMIN PO) Take 1 tablet by mouth daily.   Yes [provider]  rosuvastatin (CRESTOR) 10 MG tablet Take 1 tablet (10 mg total) by mouth daily. 08/22/19  Yes Libby Maw, MD  topiramate (TOPAMAX) 50 MG tablet Take 1 tablet (50 mg total) by mouth 2 (two) times daily. 08/22/19  Yes Libby Maw, MD  dicyclomine (BENTYL) 20 MG tablet Take  1 tablet (20 mg total) by mouth 2 (two) times daily as needed for spasms. 10/08/19   Quintella Reichert, MD  ondansetron (ZOFRAN) 4 MG tablet Take 1 tablet (4 mg total) by mouth every 8 (eight) hours as needed for nausea or vomiting. 10/08/19   Quintella Reichert, MD  lisinopril-hydrochlorothiazide (PRINZIDE,ZESTORETIC) 20-12.5 MG per tablet Take 2 tablets by mouth daily.  11/26/14  [provider]    Allergies    Patient has no known allergies.  Review of Systems   Review of Systems  Gastrointestinal: Positive for abdominal pain.  All other systems reviewed and are negative.   Physical Exam Updated Vital Signs BP 93/67 (BP Location: Left Arm)   Pulse 80   Temp 98.4 F  (36.9 C)   Resp 15   Ht 5\' 3"  (1.6 m)   Wt 76.7 kg   SpO2 100%   BMI 29.94 kg/m   Physical Exam Vitals and nursing note reviewed.  Constitutional:      Appearance: She is well-developed.  HENT:     Head: Normocephalic and atraumatic.  Cardiovascular:     Rate and Rhythm: Normal rate and regular rhythm.     Heart sounds: No murmur heard.   Pulmonary:     Effort: Pulmonary effort is normal. No respiratory distress.     Breath sounds: Normal breath sounds.  Abdominal:     Palpations: Abdomen is soft.     Tenderness: There is no guarding or rebound.     Comments: Moderate right-sided abdominal tenderness, greatest over the right lower quadrant with voluntary guarding, no rebound.  Musculoskeletal:        General: No tenderness.  Skin:    General: Skin is warm and dry.  Neurological:     Mental Status: She is alert and oriented to person, place, and time.  Psychiatric:        Behavior: Behavior normal.     ED Results / Procedures / Treatments   Labs (all labs ordered are listed, but only abnormal results are displayed) Labs Reviewed  COMPREHENSIVE METABOLIC PANEL - Abnormal; Notable for the following components:      Result Value   Potassium 3.1 (*)    Glucose, Bld 102 (*)    All other components within normal limits  URINALYSIS, ROUTINE W REFLEX MICROSCOPIC - Abnormal; Notable for the following components:   Color, Urine STRAW (*)    Specific Gravity, Urine 1.004 (*)    All other components within normal limits  I-STAT BETA HCG BLOOD, ED ([facility], [facility], [facility] ONLY) - Abnormal; Notable for the following components:   I-stat hCG, quantitative 6.9 (*)    All other components within normal limits  URINE CULTURE  LIPASE, BLOOD  CBC    EKG None  Radiology CT Abdomen Pelvis W Contrast  Result Date: 10/08/2019 CLINICAL DATA:  Right lower quadrant abdominal pain since yesterday, nausea EXAM: CT ABDOMEN AND PELVIS WITH CONTRAST TECHNIQUE: Multidetector CT imaging of the  abdomen and pelvis was performed using the standard protocol following bolus administration of intravenous contrast. CONTRAST:  134mL OMNIPAQUE IOHEXOL 350 MG/ML SOLN COMPARISON:  12/28/2016 FINDINGS: Lower chest: No acute pleural or parenchymal lung disease. Hepatobiliary: Mild hepatic steatosis. No focal liver abnormality. The gallbladder is normal. No biliary dilation. Pancreas: Unremarkable. No pancreatic ductal dilatation or surrounding inflammatory changes. Spleen: Stable benign subcentimeter hypodensity medial aspect of the spleen. Otherwise the spleen is grossly normal. Adrenals/Urinary Tract: Adrenal glands are unremarkable. Kidneys are normal, without renal calculi, focal lesion,  or hydronephrosis. Bladder is unremarkable. Stomach/Bowel: No bowel obstruction or ileus. There is a normal appendix in the right lower quadrant. There is diverticulosis of the descending and sigmoid colon, with no evidence of acute diverticulitis. No bowel wall thickening or inflammatory change. Vascular/Lymphatic: Aortic atherosclerosis. No enlarged abdominal or pelvic lymph nodes. Reproductive: Status post hysterectomy. No adnexal masses. Other: No free fluid or free gas. No abdominal wall hernia. Musculoskeletal: No acute or destructive bony lesions. Reconstructed images demonstrate no additional findings. IMPRESSION: 1. Normal appendix. 2. Diverticulosis without diverticulitis. 3. Mild hepatic steatosis. 4. Aortic Atherosclerosis (ICD10-I70.0). Electronically Signed   By: Randa Ngo M.D.   On: 10/08/2019 18:55    Procedures Procedures (including critical care time)  Medications Ordered in ED Medications  morphine 4 MG/ML injection 4 mg (4 mg Intravenous Given 10/08/19 1709)  ondansetron (ZOFRAN) injection 4 mg (4 mg Intravenous Given 10/08/19 1704)  sodium chloride 0.9 % bolus 1,000 mL (0 mLs Intravenous Stopped 10/08/19 1826)  iohexol (OMNIPAQUE) 350 MG/ML injection 100 mL (100 mLs Intravenous Contrast Given  10/08/19 1818)    ED Course  I have reviewed the triage vital signs and the nursing notes.  Pertinent labs & imaging results that were available during my care of the patient were reviewed by me and considered in my medical decision making (see chart for details).    MDM Rules/Calculators/A&P                         Patient presents the emergency department for evaluation of right lower quadrant abdominal pain that is been present since yesterday with associated vomiting. She has tenderness on examination without peritoneal findings. UA not consistent with UTI. CT is negative for acute appendicitis. On repeat evaluation she is feeling improved. Discussed with patient findings of studies. Will send urine culture and treat if it is positive. Discussed incidental findings of diverticulosis as well as atherosclerosis. Discussed PCP follow-up and return precautions.  Final Clinical Impression(s) / ED Diagnoses Final diagnoses:  Right lower quadrant abdominal pain    Rx / DC Orders ED Discharge Orders         Ordered    dicyclomine (BENTYL) 20 MG tablet  2 times daily PRN     Discontinue  Reprint     10/08/19 1903    ondansetron (ZOFRAN) 4 MG tablet  Every 8 hours PRN     Discontinue  Reprint     10/08/19 1903           Quintella Reichert, MD 10/08/19 1907

## 2019-10-08 NOTE — Progress Notes (Signed)
Established Patient Office Visit  Subjective:  Patient ID: [name], female    DOB: Jun 13, 1970  Age: 49 y.o. MRN: [id number]  CC:  Chief Complaint  Patient presents with  . Acute Visit    abdominal pains pt states that it feels like stabbing pains. Nausea come and go.     HPI [name] presents for a 1 day history of sharp stabbing pain in her abdomen.  There is been nausea without vomiting.  The car ride over here did not bother her but walking does.  Patient denies fever, dysuria increased frequency or urgency.  Normal bowel movements today and yesterday.  There is been no bladder or pus in her stool.  Past medical history of hysterectomy.  Ovaries were left intact.  Normal GYN check per her GYN doctor last year.  Recently restarted on Ditropan XL last week.  Past Medical History:  Diagnosis Date  . Back pain   . GERD (gastroesophageal reflux disease)   . High cholesterol   . Hypertension   . Migraine   . Scoliosis     Past Surgical History:  Procedure Laterality Date  . ABDOMINAL HYSTERECTOMY    . ABDOMINAL SURGERY    . CESAREAN SECTION    . HAND SURGERY      Family History  Problem Relation Age of Onset  . Hypertension Mother   . Stroke Mother   . Cancer Mother   . Diabetes Mother   . Heart attack Father     Social History   Socioeconomic History  . Marital status: Married    Spouse name: Lucious  . Number of children: 2  . Years of education: 12th  . Highest education level: Not on file  Occupational History  . 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 Smoking status: Current Every Day Smoker    Packs/day: 1.00    Years: 15.00    Pack years: 15.00    Types: Cigarettes  . Smokeless tobacco: Never Used  Vaping Use  . Vaping Use: Never used  Substance and Sexual Activity  . Alcohol use: Yes    Comment: rarely  . Drug use: No  . Sexual activity: Not on file  Other Topics Concern  . Not on  file  Social History Narrative   Patient lives at home with her family.   Caffeine use:7-8 cups daily (tea)   Social Determinants of Health   Financial Resource Strain:   . Difficulty of Paying Living Expenses:   Food Insecurity:   . Worried About Charity fundraiser in the Last Year:   . Arboriculturist in the Last Year:   Transportation Needs:   . Film/video editor (Medical):   Marland Kitchen Lack of Transportation (Non-Medical):   Physical Activity:   . Days of Exercise per Week:   . Minutes of Exercise per Session:   Stress:   . Feeling of Stress :   Social Connections:   . Frequency of Communication with Friends and Family:   . Frequency of Social Gatherings with Friends and Family:   . Attends Religious Services:   . Active Member of Clubs or Organizations:   . Attends Archivist Meetings:   Marland Kitchen Marital Status:   Intimate Partner Violence:   . Fear of Current or Ex-Partner:   . Emotionally Abused:   Marland Kitchen Physically Abused:   . Sexually Abused:  Outpatient Medications Prior to Visit  Medication Sig Dispense Refill  . chlorthalidone (HYGROTON) 25 MG tablet Take 1 tablet (25 mg total) by mouth daily. 90 tablet 1  . lisinopril (ZESTRIL) 40 MG tablet Take 1 tablet (40 mg total) by mouth daily. 90 tablet 1  . oxybutynin (DITROPAN XL) 5 MG 24 hr tablet Take 1 tablet (5 mg total) by mouth at bedtime. 30 tablet 1  . rosuvastatin (CRESTOR) 10 MG tablet Take 1 tablet (10 mg total) by mouth daily. 90 tablet 2  . topiramate (TOPAMAX) 50 MG tablet Take 1 tablet (50 mg total) by mouth 2 (two) times daily. 180 tablet 1   No facility-administered medications prior to visit.    No Known Allergies  ROS Review of Systems  Constitutional: Negative.   Respiratory: Negative.   Cardiovascular: Negative.   Gastrointestinal: Positive for abdominal pain and nausea. Negative for anal bleeding, blood in stool, constipation, diarrhea, rectal pain and vomiting.  Endocrine: Negative for  polyphagia and polyuria.  Genitourinary: Negative for difficulty urinating, dysuria, frequency and urgency.  Musculoskeletal: Negative.   Skin: Negative for pallor and rash.  Allergic/Immunologic: Negative for immunocompromised state.  Neurological: Negative for light-headedness and numbness.  Hematological: Does not bruise/bleed easily.  Psychiatric/Behavioral: Negative.       Objective:    Physical Exam Vitals reviewed.  Constitutional:      General: She is not in acute distress.    Appearance: Normal appearance. She is not ill-appearing, toxic-appearing or diaphoretic.  HENT:     Head: Normocephalic and atraumatic.     Right Ear: External ear normal.     Left Ear: External ear normal.     Mouth/Throat:     Mouth: Mucous membranes are moist.     Pharynx: Oropharynx is clear. No oropharyngeal exudate or posterior oropharyngeal erythema.  Eyes:     General:        Right eye: No discharge.        Left eye: No discharge.     Conjunctiva/sclera: Conjunctivae normal.     Pupils: Pupils are equal, round, and reactive to light.  Cardiovascular:     Rate and Rhythm: Normal rate and regular rhythm.  Pulmonary:     Effort: Pulmonary effort is normal.     Breath sounds: Normal breath sounds.  Abdominal:     General: There is no distension.     Tenderness: There is abdominal tenderness in the right lower quadrant. There is guarding (mild). There is no rebound.  Musculoskeletal:     Cervical back: No rigidity or tenderness.  Lymphadenopathy:     Cervical: No cervical adenopathy.  Skin:    General: Skin is warm and dry.  Neurological:     Mental Status: She is alert and oriented to person, place, and time.  Psychiatric:        Mood and Affect: Mood normal.        Behavior: Behavior normal.     BP 118/72   Pulse 71   Temp 98 F (36.7 C) (Tympanic)   Ht 5\' 3"  (1.6 m)   Wt 169 lb 12.8 oz (77 kg)   BMI 30.08 kg/m  Wt Readings from Last 3 Encounters:  10/08/19 169 lb 12.8  oz (77 kg)  09/30/19 169 lb 9.6 oz (76.9 kg)  08/22/19 175 lb 3.2 oz (79.5 kg)     Health Maintenance Due  Topic Date Due  . Hepatitis C Screening  Never done  . HIV Screening  Never  done  . PAP SMEAR-Modifier  Never done  . MAMMOGRAM  03/26/2018  . COLONOSCOPY  Never done  . INFLUENZA VACCINE  09/21/2019    There are no preventive care reminders to display for this patient.  Lab Results  Component Value Date   TSH 0.45 08/26/2019   Lab Results  Component Value Date   WBC 6.3 08/26/2019   HGB 14.6 08/26/2019   HCT 42.9 08/26/2019   MCV 91.5 08/26/2019   PLT 307.0 08/26/2019   Lab Results  Component Value Date   NA 140 08/26/2019   K 3.4 (L) 08/26/2019   CO2 22 08/26/2019   GLUCOSE 96 08/26/2019   BUN 20 08/26/2019   CREATININE 0.73 08/26/2019   BILITOT 0.6 08/26/2019   ALKPHOS 86 08/26/2019   AST 23 08/26/2019   ALT 17 08/26/2019   PROT 7.5 08/26/2019   ALBUMIN 4.6 08/26/2019   CALCIUM 10.1 08/26/2019   ANIONGAP 10 12/28/2016   GFR 101.53 08/26/2019   Lab Results  Component Value Date   CHOL 167 08/26/2019   Lab Results  Component Value Date   HDL 44.10 08/26/2019   Lab Results  Component Value Date   LDLCALC 105 (H) 08/26/2019   Lab Results  Component Value Date   TRIG 93.0 08/26/2019   Lab Results  Component Value Date   CHOLHDL 4 08/26/2019   Lab Results  Component Value Date   HGBA1C 6.6 (H) 08/26/2019      Assessment & Plan:   Problem List Items Addressed This Visit      Other   Right lower quadrant abdominal pain - Primary      No orders of the defined types were placed in this encounter.   Follow-up: Return To ER now..   Follow up with me in one week or sooner if needed.  Libby Maw, MD

## 2019-10-10 LAB — URINE CULTURE: Culture: NO GROWTH

## 2019-11-04 ENCOUNTER — Ambulatory Visit: Payer: 59 | Admitting: Family Medicine

## 2019-11-17 ENCOUNTER — Other Ambulatory Visit: Payer: Self-pay | Admitting: Family Medicine

## 2019-11-17 DIAGNOSIS — N3289 Other specified disorders of bladder: Secondary | ICD-10-CM

## 2019-12-22 ENCOUNTER — Other Ambulatory Visit: Payer: Self-pay | Admitting: Family Medicine

## 2019-12-22 DIAGNOSIS — N3289 Other specified disorders of bladder: Secondary | ICD-10-CM

## 2020-01-13 ENCOUNTER — Other Ambulatory Visit: Payer: Self-pay | Admitting: Family Medicine

## 2020-01-13 DIAGNOSIS — N3289 Other specified disorders of bladder: Secondary | ICD-10-CM

## 2020-01-13 NOTE — Telephone Encounter (Signed)
Last OV 10/08/19 Last fill 12/22/19  #30/1 Pt has 1 refill left.

## 2020-02-02 ENCOUNTER — Other Ambulatory Visit: Payer: Self-pay | Admitting: Family Medicine

## 2020-02-02 DIAGNOSIS — G43109 Migraine with aura, not intractable, without status migrainosus: Secondary | ICD-10-CM

## 2020-02-02 DIAGNOSIS — I10 Essential (primary) hypertension: Secondary | ICD-10-CM

## 2020-02-02 DIAGNOSIS — R7303 Prediabetes: Secondary | ICD-10-CM

## 2020-02-02 NOTE — Telephone Encounter (Signed)
Last OV 10/08/19 Last fill 08/22/19  #90/0 for all meds

## 2020-02-09 ENCOUNTER — Encounter: Payer: Self-pay | Admitting: Family

## 2020-02-09 ENCOUNTER — Other Ambulatory Visit (HOSPITAL_COMMUNITY)
Admission: RE | Admit: 2020-02-09 | Discharge: 2020-02-09 | Disposition: A | Discharge status: Home / Self Care | Payer: 59 | Source: Ambulatory Visit | Attending: Family | Admitting: Family

## 2020-02-09 ENCOUNTER — Other Ambulatory Visit: Payer: Self-pay

## 2020-02-09 ENCOUNTER — Ambulatory Visit (INDEPENDENT_AMBULATORY_CARE_PROVIDER_SITE_OTHER): Payer: 59 | Admitting: Family

## 2020-02-09 VITALS — BP 115/80 | HR 76 | Temp 97.6°F | Ht 63.0 in | Wt 171.8 lb

## 2020-02-09 DIAGNOSIS — Z113 Encounter for screening for infections with a predominantly sexual mode of transmission: Secondary | ICD-10-CM

## 2020-02-09 DIAGNOSIS — R350 Frequency of micturition: Secondary | ICD-10-CM | POA: Diagnosis present

## 2020-02-09 DIAGNOSIS — Z90710 Acquired absence of both cervix and uterus: Secondary | ICD-10-CM | POA: Insufficient documentation

## 2020-02-09 DIAGNOSIS — R739 Hyperglycemia, unspecified: Secondary | ICD-10-CM | POA: Diagnosis not present

## 2020-02-09 DIAGNOSIS — R300 Dysuria: Secondary | ICD-10-CM

## 2020-02-09 DIAGNOSIS — N898 Other specified noninflammatory disorders of vagina: Secondary | ICD-10-CM

## 2020-02-09 DIAGNOSIS — Z1272 Encounter for screening for malignant neoplasm of vagina: Secondary | ICD-10-CM | POA: Insufficient documentation

## 2020-02-09 DIAGNOSIS — Z23 Encounter for immunization: Secondary | ICD-10-CM

## 2020-02-09 DIAGNOSIS — R399 Unspecified symptoms and signs involving the genitourinary system: Secondary | ICD-10-CM

## 2020-02-09 DIAGNOSIS — R7303 Prediabetes: Secondary | ICD-10-CM

## 2020-02-09 LAB — POCT URINALYSIS DIPSTICK
Bilirubin, UA: NEGATIVE
Blood, UA: NEGATIVE
Glucose, UA: NEGATIVE
Ketones, UA: NEGATIVE
Leukocytes, UA: NEGATIVE
Nitrite, UA: NEGATIVE
Protein, UA: NEGATIVE
Spec Grav, UA: 1.01 (ref 1.010–1.025)
Urobilinogen, UA: 0.2 E.U./dL
pH, UA: 6 (ref 5.0–8.0)

## 2020-02-09 MED ORDER — FLUCONAZOLE 150 MG PO TABS: 150.0000 mg | ORAL_TABLET | Freq: Once | ORAL | 0 refills | Status: AC

## 2020-02-09 NOTE — Patient Instructions (Signed)
Urinary Frequency, Adult Urinary frequency means urinating more often than usual. You may urinate every 1-2 hours even though you drink a normal amount of fluid and do not have a bladder infection or condition. Although you urinate more often than normal, the total amount of urine produced in a day is normal. With urinary frequency, you may have an urgent need to urinate often. The stress and anxiety of needing to find a bathroom quickly can make this urge worse. This condition may go away on its own or you may need treatment at home. Home treatment may include bladder training, exercises, taking medicines, or making changes to your diet. Follow these instructions at home: Bladder health   Keep a bladder diary if told by your health care provider. Keep track of: ? What you eat and drink. ? How often you urinate. ? How much you urinate.  Follow a bladder training program if told by your health care provider. This may include: ? Learning to delay going to the bathroom. ? Double urinating (voiding). This helps if you are not completely emptying your bladder. ? Scheduled voiding.  Do Kegel exercises as told by your health care provider. Kegel exercises strengthen the muscles that help control urination, which may help the condition. Eating and drinking  If told by your health care provider, make diet changes, such as: ? Avoiding caffeine. ? Drinking fewer fluids, especially alcohol. ? Not drinking in the evening. ? Avoiding foods or drinks that may irritate the bladder. These include coffee, tea, soda, artificial sweeteners, citrus, tomato-based foods, and chocolate. ? Eating foods that help prevent or ease constipation. Constipation can make this condition worse. Your health care provider may recommend that you:  Drink enough fluid to keep your urine pale yellow.  Take over-the-counter or prescription medicines.  Eat foods that are high in fiber, such as beans, whole grains, and fresh  fruits and vegetables.  Limit foods that are high in fat and processed sugars, such as fried or sweet foods. General instructions  Take over-the-counter and prescription medicines only as told by your health care provider.  Keep all follow-up visits as told by your health care provider. This is important. Contact a health care provider if:  You start urinating more often.  You feel pain or irritation when you urinate.  You notice blood in your urine.  Your urine looks cloudy.  You develop a fever.  You begin vomiting. Get help right away if:  You are unable to urinate. Summary  Urinary frequency means urinating more often than usual. With urinary frequency, you may urinate every 1-2 hours even though you drink a normal amount of fluid and do not have a bladder infection or other bladder condition.  Your health care provider may recommend that you keep a bladder diary, follow a bladder training program, or make dietary changes.  If told by your health care provider, do Kegel exercises to strengthen the muscles that help control urination.  Take over-the-counter and prescription medicines only as told by your health care provider.  Contact a health care provider if your symptoms do not improve or get worse. This information is not intended to replace advice given to you by your health care provider. Make sure you discuss any questions you have with your health care provider. Document Revised: 08/16/2017 Document Reviewed: 08/16/2017 Elsevier Patient Education  2020 Elsevier Inc.  

## 2020-02-09 NOTE — Progress Notes (Signed)
Acute Office Visit  Subjective:    Patient ID: [name], female    DOB: [birth year], 49 y.o.   MRN: [id number]  Chief Complaint  Patient presents with  . Urinary Frequency    Concerns about UTI, urine frequency x 1 week, stomach pains x 2-3 days.     HPI Patient is in today with c/o vaginal irritation, urinary frequency, lower abdominal pain x 2-3 days. She denies any vaginal discharge or odor. She is sexually active with her husband. Has a history of hyperglycemia and has not had her glucose checked recently. She admits to drinking a lot of soda and tea. Is fasting today.   Past Medical History:  Diagnosis Date  . Back pain   . GERD (gastroesophageal reflux disease)   . High cholesterol   . Hypertension   . Migraine   . Scoliosis     Past Surgical History:  Procedure Laterality Date  . ABDOMINAL HYSTERECTOMY    . ABDOMINAL SURGERY    . CESAREAN SECTION    . HAND SURGERY      Family History  Problem Relation Age of Onset  . Hypertension Mother   . Stroke Mother   . Cancer Mother   . Diabetes Mother   . Heart attack Father     Social History   Socioeconomic History  . Marital status: Married    Spouse name: Lucious  . Number of children: 2  . Years of education: 12th  . Highest education level: Not on file  Occupational History  . 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 Smoking status: Current Every Day Smoker    Packs/day: 1.00    Years: 15.00    Pack years: 15.00    Types: Cigarettes  . Smokeless tobacco: Never Used  Vaping Use  . Vaping Use: Never used  Substance and Sexual Activity  . Alcohol use: Yes    Comment: rarely  . Drug use: No  . Sexual activity: Not on file  Other Topics Concern  . Not on file  Social History Narrative   Patient lives at home with her family.   Caffeine use:7-8 cups daily (tea)   Social Determinants of Health   Financial Resource Strain: Not on file  Food  Insecurity: Not on file  Transportation Needs: Not on file  Physical Activity: Not on file  Stress: Not on file  Social Connections: Not on file  Intimate Partner Violence: Not on file    Outpatient Medications Prior to Visit  Medication Sig Dispense Refill  . chlorthalidone (HYGROTON) 25 MG tablet TAKE 1 TABLET BY MOUTH EVERY DAY 90 tablet 1  . dicyclomine (BENTYL) 20 MG tablet Take 1 tablet (20 mg total) by mouth 2 (two) times daily as needed for spasms. 10 tablet 0  . lisinopril (ZESTRIL) 40 MG tablet TAKE 1 TABLET BY MOUTH EVERY DAY 90 tablet 1  . oxybutynin (DITROPAN-XL) 5 MG 24 hr tablet TAKE 1 TABLET BY MOUTH EVERYDAY AT BEDTIME 30 tablet 1  . rosuvastatin (CRESTOR) 10 MG tablet Take 1 tablet (10 mg total) by mouth daily. 90 tablet 2  . topiramate (TOPAMAX) 50 MG tablet TAKE 1 TABLET BY MOUTH TWICE A DAY 60 tablet 5  . Aspirin-Caffeine (BC FAST PAIN RELIEF) 845-65 MG PACK Take 2 packets by mouth daily as needed (pain). (Patient not taking: Reported on 02/09/2020)    . ondansetron (ZOFRAN) 4 MG tablet Take 1  tablet (4 mg total) by mouth every 8 (eight) hours as needed for nausea or vomiting. (Patient not taking: Reported on 02/09/2020) 10 tablet 0  . Potassium (POTASSIMIN PO) Take 1 tablet by mouth daily. (Patient not taking: Reported on 02/09/2020)     No facility-administered medications prior to visit.    No Known Allergies  Review of Systems  Respiratory: Negative.   Cardiovascular: Negative.   Gastrointestinal: Positive for abdominal pain. Negative for constipation and diarrhea.  Genitourinary: Positive for frequency. Negative for vaginal bleeding and vaginal discharge.       Vaginal irritation  Musculoskeletal: Negative.   Allergic/Immunologic: Negative.   Neurological: Negative.   Psychiatric/Behavioral: Negative.   All other systems reviewed and are negative.      Objective:    Physical Exam Vitals reviewed.  Constitutional:      Appearance: Normal  appearance.  Cardiovascular:     Rate and Rhythm: Normal rate and regular rhythm.  Pulmonary:     Effort: Pulmonary effort is normal.     Breath sounds: Normal breath sounds.  Abdominal:     General: Abdomen is flat. Bowel sounds are normal.     Palpations: Abdomen is soft.     Tenderness: There is no guarding or rebound.  Genitourinary:    General: Normal vulva.     Rectum: Normal.     Comments: Vaginal vault consistent with hyst. Musculoskeletal:        General: Normal range of motion.     Cervical back: Normal range of motion and neck supple.  Skin:    General: Skin is warm and dry.  Neurological:     General: No focal deficit present.     Mental Status: She is alert and oriented to person, place, and time.  Psychiatric:        Mood and Affect: Mood normal.        Behavior: Behavior normal.     BP 115/80   Pulse 76   Temp 97.6 F (36.4 C) (Temporal)   Ht 5\' 3"  (1.6 m)   Wt 171 lb 12.8 oz (77.9 kg)   SpO2 94%   BMI 30.43 kg/m  Wt Readings from Last 3 Encounters:  02/09/20 171 lb 12.8 oz (77.9 kg)  10/08/19 169 lb (76.7 kg)  10/08/19 169 lb 12.8 oz (77 kg)    Health Maintenance Due  Topic Date Due  . Hepatitis C Screening  Never done  . HIV Screening  Never done  . PAP SMEAR-Modifier  Never done  . MAMMOGRAM  03/26/2018  . COLONOSCOPY  Never done  . COVID-19 Vaccine (3 - Booster for Moderna series) 11/25/2019    There are no preventive care reminders to display for this patient.   Lab Results  Component Value Date   TSH 0.45 08/26/2019   Lab Results  Component Value Date   WBC 4.9 10/08/2019   HGB 14.2 10/08/2019   HCT 43.6 10/08/2019   MCV 90.5 10/08/2019   PLT 264 10/08/2019   Lab Results  Component Value Date   NA 141 10/08/2019   K 3.1 (L) 10/08/2019   CO2 22 10/08/2019   GLUCOSE 102 (H) 10/08/2019   BUN 14 10/08/2019   CREATININE 0.66 10/08/2019   BILITOT 0.7 10/08/2019   ALKPHOS 70 10/08/2019   AST 19 10/08/2019   ALT 15 10/08/2019    PROT 7.0 10/08/2019   ALBUMIN 3.8 10/08/2019   CALCIUM 9.8 10/08/2019   ANIONGAP 13 10/08/2019   GFR 101.53 08/26/2019  Lab Results  Component Value Date   CHOL 167 08/26/2019   Lab Results  Component Value Date   HDL 44.10 08/26/2019   Lab Results  Component Value Date   LDLCALC 105 (H) 08/26/2019   Lab Results  Component Value Date   TRIG 93.0 08/26/2019   Lab Results  Component Value Date   CHOLHDL 4 08/26/2019   Lab Results  Component Value Date   HGBA1C 6.6 (H) 08/26/2019       Assessment & Plan:   Problem List Items Addressed This Visit   None   Visit Diagnoses    UTI symptoms    -  Primary   Relevant Orders   POCT Urinalysis Dipstick (Completed)   GC/Chlamydia Probe Amp   Need for influenza vaccination       Relevant Orders   Flu Vaccine QUAD 6+ mos PF IM (Fluarix Quad PF) (Completed)   Dysuria       Relevant Orders   Urine Culture   GC/Chlamydia Probe Amp   Vaginal irritation       Relevant Orders   GC/Chlamydia Probe Amp   HIV antibody (with reflex)   HgB A1c   Wet prep, genital   Urinary frequency       Relevant Orders   POCT Glucose (CBG)   HgB A1c   Screen for sexually transmitted diseases       Relevant Orders   HIV antibody (with reflex)   Hyperglycemia       Relevant Orders   HgB A1c   Screening for malignant neoplasm of vagina after total hysterectomy       Relevant Orders   Cytology - PAP( [place])       Meds ordered this encounter  Medications  . fluconazole (DIFLUCAN) 150 MG tablet    Sig: Take 1 tablet (150 mg total) by mouth once for 1 dose.    Dispense:  1 tablet    Refill:  0    Call the office  With any questions or concerns. Will follow-up pending results.  Kennyth Arnold, FNP

## 2020-02-10 ENCOUNTER — Encounter: Payer: Self-pay | Admitting: Family

## 2020-02-10 LAB — URINE CULTURE
MICRO NUMBER:: 11338095
Result:: NO GROWTH
SPECIMEN QUALITY:: ADEQUATE

## 2020-02-10 LAB — GLUCOSE, POCT (MANUAL RESULT ENTRY): POC Glucose: 98 mg/dl (ref 70–99)

## 2020-02-10 LAB — HEMOGLOBIN A1C: Hgb A1c MFr Bld: 6.7 % — ABNORMAL HIGH (ref 4.6–6.5)

## 2020-02-10 LAB — HIV ANTIBODY (ROUTINE TESTING W REFLEX): HIV 1&2 Ab, 4th Generation: NONREACTIVE

## 2020-02-11 ENCOUNTER — Telehealth: Payer: Self-pay

## 2020-02-11 LAB — CERVICOVAGINAL ANCILLARY ONLY
Bacterial Vaginitis (gardnerella): NEGATIVE
Candida Glabrata: NEGATIVE
Candida Vaginitis: NEGATIVE
Chlamydia: NEGATIVE
Comment: NEGATIVE
Comment: NEGATIVE
Comment: NEGATIVE
Comment: NEGATIVE
Comment: NEGATIVE
Comment: NORMAL
Neisseria Gonorrhea: NEGATIVE
Trichomonas: NEGATIVE

## 2020-02-11 NOTE — Telephone Encounter (Signed)
Clarity given to [facility] in regards to labs.

## 2020-02-11 NOTE — Telephone Encounter (Signed)
Stanton Kidney, calling from [facility],  Need some clarification on pt's pap smear.  Please advise, CB# [phone]

## 2020-02-12 ENCOUNTER — Telehealth: Payer: Self-pay

## 2020-02-12 NOTE — Telephone Encounter (Signed)
Pt informed of lab results. 

## 2020-02-16 LAB — CYTOLOGY - PAP
Comment: NEGATIVE
Diagnosis: NEGATIVE
High risk HPV: NEGATIVE

## 2020-02-18 ENCOUNTER — Other Ambulatory Visit: Payer: Self-pay | Admitting: Family

## 2020-02-18 DIAGNOSIS — R350 Frequency of micturition: Secondary | ICD-10-CM

## 2020-02-24 ENCOUNTER — Other Ambulatory Visit: Payer: Self-pay | Admitting: Family Medicine

## 2020-02-24 DIAGNOSIS — N3289 Other specified disorders of bladder: Secondary | ICD-10-CM

## 2020-02-25 NOTE — Telephone Encounter (Signed)
Last fill 12/22/19  #30/1 Last OV 02/09/20 w/[facility]

## 2020-03-19 ENCOUNTER — Ambulatory Visit: Payer: 59 | Admitting: Family Medicine

## 2020-03-20 ENCOUNTER — Other Ambulatory Visit: Payer: Self-pay | Admitting: Family Medicine

## 2020-03-20 DIAGNOSIS — N3289 Other specified disorders of bladder: Secondary | ICD-10-CM

## 2020-04-18 ENCOUNTER — Other Ambulatory Visit: Payer: Self-pay | Admitting: Family

## 2020-04-18 DIAGNOSIS — N3289 Other specified disorders of bladder: Secondary | ICD-10-CM

## 2020-04-22 ENCOUNTER — Ambulatory Visit: Payer: 59 | Admitting: Family Medicine

## 2020-04-26 ENCOUNTER — Other Ambulatory Visit: Payer: Self-pay

## 2020-04-26 ENCOUNTER — Ambulatory Visit (INDEPENDENT_AMBULATORY_CARE_PROVIDER_SITE_OTHER): Payer: 59

## 2020-04-26 ENCOUNTER — Encounter: Payer: Self-pay | Admitting: Neurology

## 2020-04-26 ENCOUNTER — Ambulatory Visit (INDEPENDENT_AMBULATORY_CARE_PROVIDER_SITE_OTHER): Payer: 59 | Admitting: Nurse Practitioner

## 2020-04-26 ENCOUNTER — Encounter: Payer: Self-pay | Admitting: Nurse Practitioner

## 2020-04-26 VITALS — BP 130/82 | Temp 97.5°F | Ht 62.0 in | Wt 175.0 lb

## 2020-04-26 DIAGNOSIS — E7800 Pure hypercholesterolemia, unspecified: Secondary | ICD-10-CM | POA: Diagnosis not present

## 2020-04-26 DIAGNOSIS — M778 Other enthesopathies, not elsewhere classified: Secondary | ICD-10-CM

## 2020-04-26 DIAGNOSIS — E876 Hypokalemia: Secondary | ICD-10-CM

## 2020-04-26 DIAGNOSIS — M7542 Impingement syndrome of left shoulder: Secondary | ICD-10-CM

## 2020-04-26 DIAGNOSIS — I700 Atherosclerosis of aorta: Secondary | ICD-10-CM

## 2020-04-26 DIAGNOSIS — I10 Essential (primary) hypertension: Secondary | ICD-10-CM

## 2020-04-26 DIAGNOSIS — G43109 Migraine with aura, not intractable, without status migrainosus: Secondary | ICD-10-CM | POA: Diagnosis not present

## 2020-04-26 DIAGNOSIS — T502X5A Adverse effect of carbonic-anhydrase inhibitors, benzothiadiazides and other diuretics, initial encounter: Secondary | ICD-10-CM

## 2020-04-26 MED ORDER — RIZATRIPTAN BENZOATE 5 MG PO TABS: 5.0000 mg | ORAL_TABLET | ORAL | 0 refills | Status: DC | PRN

## 2020-04-26 NOTE — Progress Notes (Signed)
Subjective:  Patient ID: [name], female    DOB: 1971-01-10  Age: 50 y.o. MRN: [id number]  CC: Follow-up (Pt states she needs medication refills. Pt states she also has not been able to raise her left arm without pain since her flu shot in 01/2020.)  Shoulder Pain  The pain is present in the left shoulder. This is a new problem. The current episode started more than 1 month ago. There has been no history of extremity trauma. The problem occurs constantly. The problem has been gradually worsening. The quality of the pain is described as aching. The pain is severe. Associated symptoms include joint locking, a limited range of motion and stiffness. Pertinent negatives include no fever, inability to bear weight, itching, joint swelling, numbness or tingling. The symptoms are aggravated by activity and lying down. She has tried acetaminophen and NSAIDS for the symptoms. The treatment provided no relief. Family history does not include gout. Her past medical history is significant for diabetes. There is no history of gout, osteoarthritis or rheumatoid arthritis.   Aortic atherosclerosis (HCC) Last HgbA1c of 6.7%, no medication BP at goal with chlorthalidone and lisinopril. Continuous tobacco use.  repeat lipid panel: LDL at goal Continue crestor at current dose With aortic atherosclerosis per previous CT; it is important to quit tobacco use, maintain adequate BP control and DASH diet.   Migraine with aura and without status migrainosus, not intractable Uncontrolled with topamax 50mg  BID Unable to tolerate imitrex (upset stomach) No improvement with NSAIDs and tylenol. Some improvement with BC powder. does not want to return to [place].  maxalt Rx sent. Ref to [facility] neurology entered.  Reviewed past Medical, Social and Family history today.  Outpatient Medications Prior to Visit  Medication Sig Dispense Refill  . chlorthalidone (HYGROTON) 25 MG tablet TAKE 1 TABLET BY MOUTH EVERY DAY 90  tablet 1  . lisinopril (ZESTRIL) 40 MG tablet TAKE 1 TABLET BY MOUTH EVERY DAY 90 tablet 1  . oxybutynin (DITROPAN-XL) 5 MG 24 hr tablet TAKE 1 TABLET BY MOUTH EVERYDAY AT BEDTIME 30 tablet 1  . rosuvastatin (CRESTOR) 10 MG tablet Take 1 tablet (10 mg total) by mouth daily. 90 tablet 2  . topiramate (TOPAMAX) 50 MG tablet TAKE 1 TABLET BY MOUTH TWICE A DAY 60 tablet 5  . Aspirin-Caffeine (BC FAST PAIN RELIEF) 845-65 MG PACK Take 2 packets by mouth daily as needed (pain). (Patient not taking: No sig reported)    . dicyclomine (BENTYL) 20 MG tablet Take 1 tablet (20 mg total) by mouth 2 (two) times daily as needed for spasms. (Patient not taking: Reported on 04/26/2020) 10 tablet 0  . ondansetron (ZOFRAN) 4 MG tablet Take 1 tablet (4 mg total) by mouth every 8 (eight) hours as needed for nausea or vomiting. (Patient not taking: No sig reported) 10 tablet 0   No facility-administered medications prior to visit.    ROS See HPI  Objective:  BP 130/82 (BP Location: Right Arm, Patient Position: Sitting, Cuff Size: Large)   Temp (!) 97.5 F (36.4 C) (Temporal)   Ht 5\' 2"  (1.575 m)   Wt 175 lb (79.4 kg)   BMI 32.01 kg/m   Physical Exam Vitals reviewed.  Cardiovascular:     Pulses: Normal pulses.  Pulmonary:     Effort: Pulmonary effort is normal.  Musculoskeletal:     Right shoulder: Normal.     Left shoulder: Tenderness and bony tenderness present. No swelling or effusion. Decreased range of motion. Normal  strength. Normal pulse.     Right upper arm: Normal.     Left upper arm: Normal.     Right elbow: Normal.     Left elbow: Normal.     Right forearm: Normal.     Left forearm: Normal.     Right lower leg: No edema.     Left lower leg: No edema.  Skin:    General: Skin is warm and dry.     Findings: No erythema or rash.  Neurological:     Mental Status: She is alert and oriented to person, place, and time.    Assessment & Plan:  This visit occurred during the SARS-CoV-2 public  health emergency.  Safety protocols were in place, including screening questions prior to the visit, additional usage of staff PPE, and extensive cleaning of exam room while observing appropriate contact time as indicated for disinfecting solutions.   Malajah was seen today for follow-up.  Diagnoses and all orders for this visit:  Essential hypertension -     Basic metabolic panel  Migraine with aura and without status migrainosus, not intractable -     rizatriptan (MAXALT) 5 MG tablet; Take 1 tablet (5 mg total) by mouth as needed for migraine. May repeat in 2 hours if needed. No more than 30mg  in 24hrs -     Ambulatory referral to Neurology  Rotator cuff impingement syndrome of left shoulder -     DG Shoulder Left -     Ambulatory referral to Orthopedic Surgery -     meloxicam (MOBIC) 7.5 MG tablet; Take 1 tablet (7.5 mg total) by mouth daily. With food  Elevated cholesterol -     Lipid panel -     Hepatic function panel  Aortic atherosclerosis (HCC)  Diuretic-induced hypokalemia -     potassium chloride SA (KLOR-CON) 20 MEQ tablet; Take 2 tablets (40 mEq total) by mouth daily.   Problem List Items Addressed This Visit      Cardiovascular and Mediastinum   Aortic atherosclerosis ([place])    Last HgbA1c of 6.7%, no medication BP at goal with chlorthalidone and lisinopril. Continuous tobacco use.  repeat lipid panel: LDL at goal Continue crestor at current dose With aortic atherosclerosis per previous CT; it is important to quit tobacco use, maintain adequate BP control and DASH diet.       Hypertensive disorder - Primary   Migraine with aura and without status migrainosus, not intractable    Uncontrolled with topamax 50mg  BID Unable to tolerate imitrex (upset stomach) No improvement with NSAIDs and tylenol. Some improvement with BC powder. does not want to return to [place].  maxalt Rx sent. Ref to [facility] neurology entered.      Relevant Medications   rizatriptan  (MAXALT) 5 MG tablet   meloxicam (MOBIC) 7.5 MG tablet   Other Relevant Orders   Ambulatory referral to Neurology     Other   Hypercholesterolemia    Other Visit Diagnoses    Rotator cuff impingement syndrome of left shoulder       Relevant Medications   meloxicam (MOBIC) 7.5 MG tablet   Other Relevant Orders   DG Shoulder Left (Completed)   Ambulatory referral to Orthopedic Surgery   Diuretic-induced hypokalemia       Relevant Medications   potassium chloride SA (KLOR-CON) 20 MEQ tablet      Follow-up: Return in about 3 months (around 07/27/2020) for DM and HTN, hyperlipidemia (fasting) with Dr. Ethelene Hal.  Wilfred Lacy, NP

## 2020-04-26 NOTE — Patient Instructions (Addendum)
Stable renal function with low potassium due to use of chlorthalidone. Potassium supplement sent. Normal liver function. Normal lipid panel: continue crestor. With aortic atherosclerosis per previous CT; it is important to quit tobacco use, maintain adequate BP control and DASH diet.  Use maxalt and tylenol 650mg  for acute headache Stop Goody's and/or BC powder. You will be contacted to schedule an appt with neurology

## 2020-04-26 NOTE — Assessment & Plan Note (Addendum)
Last HgbA1c of 6.7%, no medication BP at goal with chlorthalidone and lisinopril. Continuous tobacco use.  repeat lipid panel: LDL at goal Continue crestor at current dose With aortic atherosclerosis per previous CT; it is important to quit tobacco use, maintain adequate BP control and DASH diet.

## 2020-04-26 NOTE — Assessment & Plan Note (Signed)
Uncontrolled with topamax 50mg  BID Unable to tolerate imitrex (upset stomach) No improvement with NSAIDs and tylenol. Some improvement with BC powder. does not want to return to [place].  maxalt Rx sent. Ref to [facility] neurology entered.

## 2020-04-27 ENCOUNTER — Encounter: Payer: Self-pay | Admitting: Nurse Practitioner

## 2020-04-27 LAB — LIPID PANEL
Cholesterol: 155 mg/dL (ref 0–200)
HDL: 44.1 mg/dL (ref >39.00)
LDL Cholesterol: 91 mg/dL (ref 0–99)
NonHDL: 110.94
Total CHOL/HDL Ratio: 4
Triglycerides: 98 mg/dL (ref 0.0–149.0)
VLDL: 19.6 mg/dL (ref 0.0–40.0)

## 2020-04-27 LAB — HEPATIC FUNCTION PANEL
ALT: 14 U/L (ref 0–35)
AST: 17 U/L (ref 0–37)
Albumin: 4.1 g/dL (ref 3.5–5.2)
Alkaline Phosphatase: 77 U/L (ref 39–117)
Bilirubin, Direct: 0.1 mg/dL (ref 0.0–0.3)
Total Bilirubin: 0.5 mg/dL (ref 0.2–1.2)
Total Protein: 7 g/dL (ref 6.0–8.3)

## 2020-04-27 LAB — BASIC METABOLIC PANEL
BUN: 20 mg/dL (ref 6–23)
CO2: 25 mEq/L (ref 19–32)
Calcium: 9.9 mg/dL (ref 8.4–10.5)
Chloride: 104 mEq/L (ref 96–112)
Creatinine, Ser: 0.67 mg/dL (ref 0.40–1.20)
GFR: 100.73 mL/min (ref >60.00)
Glucose, Bld: 93 mg/dL (ref 70–99)
Potassium: 3.3 mEq/L — ABNORMAL LOW (ref 3.5–5.1)
Sodium: 140 mEq/L (ref 135–145)

## 2020-04-27 MED ORDER — MELOXICAM 7.5 MG PO TABS: 7.5000 mg | ORAL_TABLET | Freq: Every day | ORAL | 0 refills | Status: DC

## 2020-04-27 MED ORDER — POTASSIUM CHLORIDE CRYS ER 20 MEQ PO TBCR: 40.0000 meq | EXTENDED_RELEASE_TABLET | Freq: Every day | ORAL | 0 refills | Status: DC

## 2020-05-09 ENCOUNTER — Other Ambulatory Visit: Payer: Self-pay | Admitting: Nurse Practitioner

## 2020-05-09 DIAGNOSIS — G43109 Migraine with aura, not intractable, without status migrainosus: Secondary | ICD-10-CM

## 2020-05-13 ENCOUNTER — Other Ambulatory Visit: Payer: Self-pay | Admitting: Family

## 2020-05-13 DIAGNOSIS — N3289 Other specified disorders of bladder: Secondary | ICD-10-CM

## 2020-05-18 ENCOUNTER — Encounter: Payer: Self-pay | Admitting: Orthopaedic Surgery

## 2020-05-18 ENCOUNTER — Ambulatory Visit (INDEPENDENT_AMBULATORY_CARE_PROVIDER_SITE_OTHER): Payer: 59 | Admitting: Orthopaedic Surgery

## 2020-05-18 DIAGNOSIS — M25512 Pain in left shoulder: Secondary | ICD-10-CM

## 2020-05-18 DIAGNOSIS — G8929 Other chronic pain: Secondary | ICD-10-CM

## 2020-05-18 MED ORDER — LIDOCAINE HCL 1 % IJ SOLN
3.0000 mL | INTRAMUSCULAR | Status: AC | PRN
  Administered 2020-05-18: 3 mL

## 2020-05-18 MED ORDER — BUPIVACAINE HCL 0.5 % IJ SOLN
3.0000 mL | INTRAMUSCULAR | Status: AC | PRN
  Administered 2020-05-18: 3 mL via INTRA_ARTICULAR

## 2020-05-18 MED ORDER — METHYLPREDNISOLONE ACETATE 40 MG/ML IJ SUSP
40.0000 mg | INTRAMUSCULAR | Status: AC | PRN
  Administered 2020-05-18: 40 mg via INTRA_ARTICULAR

## 2020-05-18 NOTE — Progress Notes (Signed)
Office Visit Note   Patient: [name]           Date of Birth: 11-Feb-1971           MRN: [id number] Visit Date: 05/18/2020              Requested by: Flossie Buffy, NP [address] [place],  [place] [id number] PCP: Libby Maw, MD   Assessment & Plan: Visit Diagnoses:  1. Chronic left shoulder pain     Plan: Impression is chronic left shoulder pain subacromial bursitis versus adhesive capsulitis.  I reviewed the recent x-rays which show some irregularity near the greater tuberosity but no structural problems or acute problems otherwise.  I have recommended trying a subacromial injection today followed by some home exercises if she notices improvement from this injection.  She will follow-up with [place] in 2 to 4 weeks if she does not feel any improvement at which point we may need to consider glenohumeral injection.  Follow-Up Instructions: Return if symptoms worsen or fail to improve.   Orders:  No orders of the defined types were placed in this encounter.  No orders of the defined types were placed in this encounter.     Procedures: Large Joint Inj: L subacromial bursa on 05/18/2020 4:40 PM Indications: pain Details: 22 G needle  Arthrogram: No  Medications: 3 mL lidocaine 1 %; 3 mL bupivacaine 0.5 %; 40 mg methylPREDNISolone acetate 40 MG/ML Outcome: tolerated well, no immediate complications Patient was prepped and draped in the usual sterile fashion.       Clinical Data: No additional findings.   Subjective: Chief Complaint  Patient presents with  . Left Shoulder - Pain    [name] comes in for evaluation of chronic left shoulder pain since December.  She underwent a flu shot and then several Covid shots after that and since then she has had progressively worsening left shoulder pain and stiffness.  Denies any numbness and tingling or neck pain or radicular symptoms.  She has pain with raising her arm and she can barely put on deodorant  because of limited abduction due to pain.   Review of Systems  Constitutional: Negative.   HENT: Negative.   Eyes: Negative.   Respiratory: Negative.   Cardiovascular: Negative.   Endocrine: Negative.   Musculoskeletal: Negative.   Neurological: Negative.   Hematological: Negative.   Psychiatric/Behavioral: Negative.   All other systems reviewed and are negative.    Objective: Vital Signs: There were no vitals taken for this visit.  Physical Exam Vitals and nursing note reviewed.  Constitutional:      Appearance: She is well-developed.  HENT:     Head: Normocephalic and atraumatic.  Pulmonary:     Effort: Pulmonary effort is normal.  Abdominal:     Palpations: Abdomen is soft.  Musculoskeletal:     Cervical back: Neck supple.  Skin:    General: Skin is warm.     Capillary Refill: Capillary refill takes less than 2 seconds.  Neurological:     Mental Status: She is alert and oriented to person, place, and time.  Psychiatric:        Behavior: Behavior normal.        Thought Content: Thought content normal.        Judgment: Judgment normal.     Ortho Exam Left shoulder shows severe pain with attempted passive range of motion with abduction forward flexion external rotation.  She is not stuck like traditional  adhesive capsulitis.  Strength is difficult to test secondary to significant pain and guarding. Specialty Comments:  No specialty comments available.  Imaging: No results found.   PMFS History: Patient Active Problem List   Diagnosis Date Noted  . Aortic atherosclerosis ([place]) 04/26/2020  . Right lower quadrant abdominal pain 10/08/2019  . Prediabetes 08/22/2019  . Hypercholesterolemia 08/22/2019  . Hypertensive disorder 08/22/2019  . Tobacco use 08/22/2019  . Migraine with aura and without status migrainosus, not intractable 07/08/2013  . 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 Back pain   . GERD  (gastroesophageal reflux disease)   . High cholesterol   . Hypertension   . Migraine   . Scoliosis     Family History  Problem Relation Age of Onset  . Hypertension Mother   . Stroke Mother   . Cancer Mother   . Diabetes Mother   . Heart attack Father     Past Surgical History:  Procedure Laterality Date  . ABDOMINAL HYSTERECTOMY    . ABDOMINAL SURGERY    . CESAREAN SECTION    . HAND SURGERY     Social History   Occupational History  . 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 Smoking status: Current Every Day Smoker    Packs/day: 1.00    Years: 15.00    Pack years: 15.00    Types: Cigarettes  . Smokeless tobacco: Never Used  Vaping Use  . Vaping Use: Never used  Substance and Sexual Activity  . Alcohol use: Yes    Comment: rarely  . Drug use: No  . Sexual activity: Not on file

## 2020-05-19 NOTE — Progress Notes (Signed)
NEUROLOGY CONSULTATION NOTE  ROCKLYN MAYBERRY MRN: [id number] DOB: 12-20-1970  Referring provider: Flossie Buffy, NP Primary care provider: Libby Maw, MD  Reason for consult:  migraines  Assessment/Plan:   1.  Migraine with aura, without status migrainosus, not intractable 2.  Medication overuse headache  1.  Migraine prevention:  Emgality every 28 days 2.  Migraine rescue:  Rizatriptan 10mg .  Limit use to no more than 2 days out of week.  Zofran for nausea 3.  Advised to stop all pain relievers and analgesics, including Goody powder.  Discussed rebound headache 4.  Keep headache diary. 5.  Follow up 4 to 6 months.   Subjective:  [name] is a 50 year old female with HTN, aortic atherosclerosis, and high cholesterol who presents for migraines.  History supplemented by prior neurologist's and referring provider's note.  She started having migraines in her 59s.  They are severe, pounding, holocephalic pain.  Sometimes blackout of vision and scotoma prior to headache onset for 10-15 minutes.  Associated with nausea photophobia, phonophobia, blurred vision, dizziness.  She denies associated unilateral numbness or weakness.  Usually would last 1 to 2 days.  She had been on topiramate for several years.  While she has not had a migraine in 4 years, she does have a dull daily holocephalic headache for which she would treat with Goody powder.  She saw her PCP last month who took her off of topiramate.  Migraine headaches returned.  She was given rizatriptan which helps but she does not have enough.  They have lasted a week.  Current NSAIDS/analgesics:  meloxicam 7.5mg  daily, Goody/BC powder Current triptans:  Rizatriptan 5mg  Current ergotamine:  none Current anti-emetic:  none Current muscle relaxants:  none Current Antihypertensive medications:  Lisinopril, chlorthalidone Current Antidepressant medications:  none Current Anticonvulsant medications:  none Current  anti-CGRP:  none Current Vitamins/Herbal/Supplements:  none Current Antihistamines/Decongestants:  none Other therapy:  none Hormone/birth control:  none  Past NSAIDS/analgesics:  diclofenac EC tab, ibuprofen, naproxen Past abortive triptans:  Sumatriptan (upset stomach) Past abortive ergotamine:  none Past muscle relaxants:  Baclofen, tizanidine Past anti-emetic:  Zofran, Phenergan Past antihypertensive medications:  HCTZ Past antidepressant medications:  amitriptyline Past anticonvulsant medications:  Zonisamide, topiramate Past anti-CGRP:  none Past vitamins/Herbal/Supplements:  none Past antihistamines/decongestants:  none Other past therapies:  none    04/26/2020 LABS:  Na 140, K 3.3, Cl 104, CO2 22, glucose 102, BUN 14, Cr 0.67, GFR 100.73, t bili 0.5, ALP 77, AST 17, ALT 14  PAST MEDICAL HISTORY: Past Medical History:  Diagnosis Date  . Back pain   . GERD (gastroesophageal reflux disease)   . High cholesterol   . Hypertension   . Migraine   . Scoliosis     PAST SURGICAL HISTORY: Past Surgical History:  Procedure Laterality Date  . ABDOMINAL HYSTERECTOMY    . ABDOMINAL SURGERY    . CESAREAN SECTION    . HAND SURGERY      MEDICATIONS: Current Outpatient Medications on File Prior to Visit  Medication Sig Dispense Refill  . chlorthalidone (HYGROTON) 25 MG tablet TAKE 1 TABLET BY MOUTH EVERY DAY 90 tablet 1  . lisinopril (ZESTRIL) 40 MG tablet TAKE 1 TABLET BY MOUTH EVERY DAY 90 tablet 1  . meloxicam (MOBIC) 7.5 MG tablet Take 1 tablet (7.5 mg total) by mouth daily. With food 14 tablet 0  . oxybutynin (DITROPAN-XL) 5 MG 24 hr tablet TAKE 1 TABLET BY MOUTH EVERYDAY AT BEDTIME 30  tablet 1  . potassium chloride SA (KLOR-CON) 20 MEQ tablet Take 2 tablets (40 mEq total) by mouth daily. 6 tablet 0  . rizatriptan (MAXALT) 5 MG tablet TAKE 1 TABLET (5 MG TOTAL) BY MOUTH AS NEEDED FOR MIGRAINE. MAY REPEAT IN 2 HOURS IF NEEDED. NO MORE THAN 30MG  IN 24HRS 10 tablet 0  .  rosuvastatin (CRESTOR) 10 MG tablet Take 1 tablet (10 mg total) by mouth daily. 90 tablet 2  . topiramate (TOPAMAX) 50 MG tablet TAKE 1 TABLET BY MOUTH TWICE A DAY 60 tablet 5  . [DISCONTINUED] lisinopril-hydrochlorothiazide (PRINZIDE,ZESTORETIC) 20-12.5 MG per tablet Take 2 tablets by mouth daily.     No current facility-administered medications on file prior to visit.    ALLERGIES: No Known Allergies  FAMILY HISTORY: Family History  Problem Relation Age of Onset  . Hypertension Mother   . Stroke Mother   . Cancer Mother   . Diabetes Mother   . Heart attack Father     Objective:  Blood pressure 113/81, pulse 75, resp. rate 18, height 5\' 2"  (1.575 m), weight 180 lb (81.6 kg), SpO2 96 %. General: No acute distress.  Patient appears well-groomed.   Head:  Normocephalic/atraumatic Eyes:  fundi examined but not visualized Neck: supple, no paraspinal tenderness, full range of motion Back: No paraspinal tenderness Heart: regular rate and rhythm Lungs: Clear to auscultation bilaterally. Vascular: No carotid bruits. Neurological Exam: Mental status: alert and oriented to person, place, and time, recent and remote memory intact, fund of knowledge intact, attention and concentration intact, speech fluent and not dysarthric, language intact. Cranial nerves: CN I: not tested CN II: pupils equal, round and reactive to light, visual fields intact CN III, IV, VI:  full range of motion, no nystagmus, no ptosis CN V: facial sensation intact. CN VII: upper and lower face symmetric CN VIII: hearing intact CN IX, X: gag intact, uvula midline CN XI: sternocleidomastoid and trapezius muscles intact CN XII: tongue midline Bulk & Tone: normal, no fasciculations. Motor:  muscle strength 5/5 throughout Sensation:  Pinprick, temperature and vibratory sensation intact. Deep Tendon Reflexes:  2+ throughout,  toes downgoing.   Finger to nose testing:  Without dysmetria.   Heel to shin:  Without  dysmetria.   Gait:  Normal station and stride.  Romberg negative.    Thank you for allowing me to take part in the care of this patient.  Metta Clines, DO  CC:  Abelino Derrick, MD  Wilfred Lacy, NP

## 2020-05-20 ENCOUNTER — Other Ambulatory Visit: Payer: Self-pay

## 2020-05-20 ENCOUNTER — Ambulatory Visit: Payer: 59 | Admitting: Neurology

## 2020-05-20 ENCOUNTER — Encounter: Payer: Self-pay | Admitting: Neurology

## 2020-05-20 VITALS — BP 113/81 | HR 75 | Resp 18 | Ht 62.0 in | Wt 180.0 lb

## 2020-05-20 DIAGNOSIS — G4440 Drug-induced headache, not elsewhere classified, not intractable: Secondary | ICD-10-CM

## 2020-05-20 DIAGNOSIS — G43109 Migraine with aura, not intractable, without status migrainosus: Secondary | ICD-10-CM

## 2020-05-20 MED ORDER — EMGALITY 120 MG/ML ~~LOC~~ SOAJ: 120.0000 mg | SUBCUTANEOUS | 5 refills | Status: AC

## 2020-05-20 MED ORDER — ONDANSETRON 4 MG PO TBDP: 4.0000 mg | ORAL_TABLET | Freq: Three times a day (TID) | ORAL | 5 refills | Status: DC | PRN

## 2020-05-20 MED ORDER — RIZATRIPTAN BENZOATE 10 MG PO TBDP: ORAL_TABLET | ORAL | 5 refills | Status: DC

## 2020-05-20 NOTE — Patient Instructions (Signed)
  1. Start Emgality - first dose 2 injections - then 1 injection every 28 days 2. Take rizatriptan 10mg  at earliest onset of headache.  May repeat dose once in 2 hours if needed.  Maximum 2 tablets in 24 hours. 3. Zofran for nausea 4. STOP ALL OTHER PAIN RELIEVERS.  Limit use of pain relievers to no more than 2 days out of the week.  These medications include acetaminophen, NSAIDs (ibuprofen/Advil/Motrin, naproxen/Aleve, triptans (Imitrex/sumatriptan), Excedrin, and narcotics.  This will help reduce risk of rebound headaches. 5. Be aware of common food triggers:  - Caffeine:  coffee, black tea, cola, Mt. Dew  - Chocolate  - Dairy:  aged cheeses (brie, blue, cheddar, gouda, [place], provolone, [place], Swiss, etc), chocolate milk, buttermilk, sour cream, limit eggs and yogurt  - Nuts, peanut butter  - Alcohol  - Cereals/grains:  FRESH breads (fresh bagels, sourdough, doughnuts), yeast productions  - Processed/canned/aged/cured meats (pre-packaged deli meats, hotdogs)  - MSG/glutamate:  soy sauce, flavor enhancer, pickled/preserved/marinated foods  - Sweeteners:  aspartame (Equal, Nutrasweet).  Sugar and Splenda are okay  - Vegetables:  legumes (lima beans, lentils, snow peas, fava beans, pinto peans, peas, garbanzo beans), sauerkraut, onions, olives, pickles  - Fruit:  avocados, bananas, citrus fruit (orange, lemon, grapefruit), mango  - Other:  Frozen meals, macaroni and cheese 6. Routine exercise 7. Stay adequately hydrated (aim for 64 oz water daily) 8. Keep headache diary 9. Maintain proper stress management 10. Maintain proper sleep hygiene 11. Do not skip meals 12. Consider supplements:  magnesium citrate 400mg  daily, riboflavin 400mg  daily, coenzyme Q10 100mg  three times daily.

## 2020-05-25 ENCOUNTER — Ambulatory Visit: Payer: 59 | Admitting: Orthopaedic Surgery

## 2020-06-07 ENCOUNTER — Encounter: Payer: Self-pay | Admitting: Neurology

## 2020-06-07 NOTE — Progress Notes (Signed)
Received Emgality approval BUT only for 1 per 30 days. Detailed info in letter of partial approval- scanned to chart.

## 2020-06-08 ENCOUNTER — Other Ambulatory Visit: Payer: Self-pay | Admitting: Family

## 2020-06-08 DIAGNOSIS — N3289 Other specified disorders of bladder: Secondary | ICD-10-CM

## 2020-06-14 ENCOUNTER — Other Ambulatory Visit: Payer: Self-pay

## 2020-06-14 ENCOUNTER — Ambulatory Visit (INDEPENDENT_AMBULATORY_CARE_PROVIDER_SITE_OTHER): Payer: 59 | Admitting: Family Medicine

## 2020-06-14 ENCOUNTER — Telehealth: Payer: Self-pay | Admitting: Family Medicine

## 2020-06-14 ENCOUNTER — Encounter: Payer: Self-pay | Admitting: Family Medicine

## 2020-06-14 ENCOUNTER — Other Ambulatory Visit: Payer: Self-pay | Admitting: Family Medicine

## 2020-06-14 VITALS — BP 116/64 | Temp 97.8°F | Ht 62.0 in | Wt 177.0 lb

## 2020-06-14 DIAGNOSIS — R1031 Right lower quadrant pain: Secondary | ICD-10-CM

## 2020-06-14 DIAGNOSIS — G43109 Migraine with aura, not intractable, without status migrainosus: Secondary | ICD-10-CM

## 2020-06-14 DIAGNOSIS — N3289 Other specified disorders of bladder: Secondary | ICD-10-CM

## 2020-06-14 LAB — POCT URINALYSIS DIPSTICK
Bilirubin, UA: NEGATIVE
Blood, UA: NEGATIVE
Glucose, UA: NEGATIVE
Ketones, UA: NEGATIVE
Leukocytes, UA: NEGATIVE
Nitrite, UA: NEGATIVE
Protein, UA: NEGATIVE
Spec Grav, UA: 1.015 (ref 1.010–1.025)
Urobilinogen, UA: NEGATIVE E.U./dL — AB
pH, UA: 7.5 (ref 5.0–8.0)

## 2020-06-14 MED ORDER — ACETAMINOPHEN-CODEINE #3 300-30 MG PO TABS: 1.0000 | ORAL_TABLET | ORAL | 0 refills | Status: DC | PRN

## 2020-06-14 MED ORDER — OXYBUTYNIN CHLORIDE ER 5 MG PO TB24: ORAL_TABLET | ORAL | 1 refills | Status: DC

## 2020-06-14 NOTE — Progress Notes (Signed)
[facility] [address] [place] [address] Dept: [phone] Dept Fax: [phone]  Office Visit  Subjective:    Patient ID: [name], female    DOB: 11-Sep-1970, 50 y.o..   MRN: [id number]  Chief Complaint  Patient presents with  . Acute Visit    C/o having possible UTI, bladder leakage and low abdominal  pain x 1 week.       History of Present Illness:  Patient is in today for a 1-week history of right lower abdominal pain. She states that her stomach has been bothersome this past week. She wondered if she might have a UTI, because she has tried to take tub baths, but notes a dirty film on the water. She states, she knows she is not dirty and that her tub is clean prior to the bath, so has struggled to understand this. She does have occasionally urinary incontinence and wears panty liners for this. She does not believe she is having any vaginal discharge, denies burning or itching. she has some constipation issues at times, but this has not been worse recently.  She denies fever. She has had a past hysterectomy and had prior C-sections. She notes she had a similar pain last year and was sent to the ED with a concern for an appendicitis. She notes she had a CY scan, but that nothing was found to explain her pain. She says she went to another hospital, intent on getting a 2nd opinion, but then left as the wait would be too long. Her pain eventually resolved.  Past Medical History: Patient Active Problem List   Diagnosis Date Noted  . Aortic atherosclerosis ([place]) 04/26/2020  . Right lower quadrant abdominal pain 10/08/2019  . Prediabetes 08/22/2019  . Hypercholesterolemia 08/22/2019  . Hypertensive disorder 08/22/2019  . Tobacco use 08/22/2019  . Migraine with aura and without status migrainosus, not intractable 07/08/2013  . Analgesic overuse headache 07/08/2013   Past Surgical History:  Procedure Laterality Date  .  ABDOMINAL HYSTERECTOMY    . ABDOMINAL SURGERY    . CESAREAN SECTION    . HAND SURGERY     Family History  Problem Relation Age of Onset  . Hypertension Mother   . Stroke Mother   . Cancer Mother   . Diabetes Mother   . Heart attack Father    Outpatient Medications Prior to Visit  Medication Sig Dispense Refill  . chlorthalidone (HYGROTON) 25 MG tablet TAKE 1 TABLET BY MOUTH EVERY DAY 90 tablet 1  . Galcanezumab-gnlm (EMGALITY) 120 MG/ML SOAJ Inject 120 mg into the skin every 28 (twenty-eight) days. 1.12 mL 5  . lisinopril (ZESTRIL) 40 MG tablet TAKE 1 TABLET BY MOUTH EVERY DAY 90 tablet 1  . meloxicam (MOBIC) 7.5 MG tablet Take 1 tablet (7.5 mg total) by mouth daily. With food 14 tablet 0  . ondansetron (ZOFRAN ODT) 4 MG disintegrating tablet Take 1 tablet (4 mg total) by mouth every 8 (eight) hours as needed for nausea or vomiting. 20 tablet 5  . oxybutynin (DITROPAN-XL) 5 MG 24 hr tablet TAKE 1 TABLET BY MOUTH EVERYDAY AT BEDTIME 30 tablet 1  . potassium chloride SA (KLOR-CON) 20 MEQ tablet Take 2 tablets (40 mEq total) by mouth daily. 6 tablet 0  . rizatriptan (MAXALT-MLT) 10 MG disintegrating tablet Take 1 tablet earliest onset of migraine.  May repeat in 2 hours if needed. Maximum 2 tablets 24 hours. 10 tablet 5  . rosuvastatin (CRESTOR) 10  MG tablet Take 1 tablet (10 mg total) by mouth daily. 90 tablet 2   No facility-administered medications prior to visit.   No Known Allergies    Objective:   Today's Vitals   06/14/20 1431  BP: 116/64  Temp: 97.8 F (36.6 C)  TempSrc: Temporal  Weight: 177 lb (80.3 kg)  Height: 5\' 2"  (1.575 m)   Body mass index is 32.37 kg/m.   General: Well developed, well nourished. No acute distress. Abdomen: Soft. Bowel sounds positive, normal pitch and frequency. No hepatosplenomegaly. There is tenderness over   the right lower abdomen with mild guarding but no rebound. Back: Straight. No CVA tenderness bilaterally. Psych: Alert and  oriented. Normal mood and affect.  Health Maintenance Due  Topic Date Due  . Hepatitis C Screening  Never done  . COLONOSCOPY (Pts 45-71yrs Insurance coverage will need to be confirmed)  Never done  . MAMMOGRAM  03/26/2018  . COVID-19 Vaccine (3 - Booster for Moderna series) 11/25/2019   Imaging: CT of Abdomen (10/10/2019) 1. No acute abdominopelvic abnormalities. 2. Colonic diverticulosis without evidence of diverticulitis. 3. Punctate nonobstructing left upper pole renal calculus.  Urinalysis Negative for glucose, bilirubin, red cells, leukocytes, or nitrite.  Assessment & Plan:   1. Right lower quadrant abdominal pain [name] has a recurrence of right lower quadrant/right pelvic pain. I will order a CBC and CMP to evaluate for potential causes. I will schedule her for an ultrasound of the abdomen and pelvis.  I will provide her with T#3 for sparing use for pain. She will follow-up next week with Dr. Ethelene Hal, her PCP.  - POCT Urinalysis Dipstick - CBC - Comprehensive metabolic panel - US Abdomen Complete; Future - US Pelvis Complete; Future - acetaminophen-codeine (TYLENOL #3) 300-30 MG tablet; Take 1-2 tablets by mouth every 4 (four) hours as needed for moderate pain.  Dispense: 20 tablet; Refill: 0  Haydee Salter, MD

## 2020-06-14 NOTE — Telephone Encounter (Signed)
Refill sent in

## 2020-06-14 NOTE — Telephone Encounter (Signed)
What is the name of the medication? oxybutynin (DITROPAN-XL) 5 MG 24 hr tablet [[id number]]    Have you contacted your pharmacy to request a refill? Yes, she is needing a refill.  Which pharmacy would you like this sent to? Pharmacy  [id number] [place], [place] [facility] [place]  [address], [place] [address]  Phone:  [phone] Fax:  [phone]  DEA #:  [id number]      Patient notified that their request is being sent to the clinical staff for review and that they should receive a call once it is complete. If they do not receive a call within 72 hours they can check with their pharmacy or our office.

## 2020-06-15 ENCOUNTER — Ambulatory Visit (INDEPENDENT_AMBULATORY_CARE_PROVIDER_SITE_OTHER): Payer: 59 | Admitting: Orthopaedic Surgery

## 2020-06-15 ENCOUNTER — Encounter: Payer: Self-pay | Admitting: Family Medicine

## 2020-06-15 DIAGNOSIS — G8929 Other chronic pain: Secondary | ICD-10-CM | POA: Diagnosis not present

## 2020-06-15 DIAGNOSIS — M25512 Pain in left shoulder: Secondary | ICD-10-CM

## 2020-06-15 DIAGNOSIS — E876 Hypokalemia: Secondary | ICD-10-CM | POA: Insufficient documentation

## 2020-06-15 DIAGNOSIS — T502X5A Adverse effect of carbonic-anhydrase inhibitors, benzothiadiazides and other diuretics, initial encounter: Secondary | ICD-10-CM | POA: Insufficient documentation

## 2020-06-15 LAB — CBC
HCT: 41.9 % (ref 36.0–46.0)
Hemoglobin: 14.2 g/dL (ref 12.0–15.0)
MCHC: 33.9 g/dL (ref 30.0–36.0)
MCV: 89.4 fl (ref 78.0–100.0)
Platelets: 285 10*3/uL (ref 150.0–400.0)
RBC: 4.69 Mil/uL (ref 3.87–5.11)
RDW: 14 % (ref 11.5–15.5)
WBC: 6 10*3/uL (ref 4.0–10.5)

## 2020-06-15 LAB — COMPREHENSIVE METABOLIC PANEL
ALT: 14 U/L (ref 0–35)
AST: 17 U/L (ref 0–37)
Albumin: 4.2 g/dL (ref 3.5–5.2)
Alkaline Phosphatase: 87 U/L (ref 39–117)
BUN: 17 mg/dL (ref 6–23)
CO2: 27 mEq/L (ref 19–32)
Calcium: 9.8 mg/dL (ref 8.4–10.5)
Chloride: 102 mEq/L (ref 96–112)
Creatinine, Ser: 0.94 mg/dL (ref 0.40–1.20)
GFR: 69.91 mL/min (ref >60.00)
Glucose, Bld: 124 mg/dL — ABNORMAL HIGH (ref 70–99)
Potassium: 3 mEq/L — ABNORMAL LOW (ref 3.5–5.1)
Sodium: 138 mEq/L (ref 135–145)
Total Bilirubin: 0.5 mg/dL (ref 0.2–1.2)
Total Protein: 7.3 g/dL (ref 6.0–8.3)

## 2020-06-15 NOTE — Progress Notes (Signed)
Office Visit Note   Patient: [name]           Date of Birth: May 01, 1970           MRN: [id number] Visit Date: 06/15/2020              Requested by: Libby Maw, MD [address] [place],  [place] [address] PCP: Libby Maw, MD   Assessment & Plan: Visit Diagnoses:  1. Chronic left shoulder pain     Plan: Impression is continued left shoulder pain without relief from subacromial injection.  MR arthrogram ordered today to rule out structural abnormalities.  If this study is relatively unrevealing we we will then need to obtain a C-spine MRI.  She will follow-up after the MRI.  Follow-Up Instructions: Return if symptoms worsen or fail to improve.   Orders:  Orders Placed This Encounter  Procedures  . MR Shoulder Left w/ contrast  . Arthrogram   No orders of the defined types were placed in this encounter.     Procedures: No procedures performed   Clinical Data: No additional findings.   Subjective: Chief Complaint  Patient presents with  . Left Shoulder - Pain    [name] returns today for continued left shoulder pain.  I gave her a subacromial injection about 2 weeks ago which she states helped about 10% but she continues to have pain with essentially most daily activities that require use of the left arm.  She wakes up with pain.   Review of Systems  Constitutional: Negative.   HENT: Negative.   Eyes: Negative.   Respiratory: Negative.   Cardiovascular: Negative.   Endocrine: Negative.   Musculoskeletal: Negative.   Neurological: Negative.   Hematological: Negative.   Psychiatric/Behavioral: Negative.   All other systems reviewed and are negative.    Objective: Vital Signs: There were no vitals taken for this visit.  Physical Exam Vitals and nursing note reviewed.  Constitutional:      Appearance: She is well-developed.  Pulmonary:     Effort: Pulmonary effort is normal.  Skin:    General: Skin is warm.      Capillary Refill: Capillary refill takes less than 2 seconds.  Neurological:     Mental Status: She is alert and oriented to person, place, and time.  Psychiatric:        Behavior: Behavior normal.        Thought Content: Thought content normal.        Judgment: Judgment normal.     Ortho Exam Left shoulder exam is difficult to perform due to extreme guarding from pain. Specialty Comments:  No specialty comments available.  Imaging: No results found.   PMFS History: Patient Active Problem List   Diagnosis Date Noted  . Hypokalemia 06/15/2020  . Aortic atherosclerosis ([place]) 04/26/2020  . Right lower quadrant abdominal pain 10/08/2019  . Prediabetes 08/22/2019  . Hypercholesterolemia 08/22/2019  . Hypertensive disorder 08/22/2019  . Tobacco use 08/22/2019  . Migraine with aura and without status migrainosus, not intractable 07/08/2013  . 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 Back pain   . GERD (gastroesophageal reflux disease)   . High cholesterol   . Hypertension   . Migraine   . Scoliosis     Family History  Problem Relation Age of Onset  . Hypertension Mother   . Stroke Mother   . Cancer Mother   . Diabetes Mother   .  Heart attack Father     Past Surgical History:  Procedure Laterality Date  . ABDOMINAL HYSTERECTOMY    . ABDOMINAL SURGERY    . CESAREAN SECTION    . HAND SURGERY     Social History   Occupational History  . 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 Smoking status: Current Every Day Smoker    Packs/day: 1.00    Years: 15.00    Pack years: 15.00    Types: Cigarettes  . Smokeless tobacco: Never Used  Vaping Use  . Vaping Use: Never used  Substance and Sexual Activity  . Alcohol use: Yes    Comment: rarely  . Drug use: No  . Sexual activity: Not on file

## 2020-06-21 ENCOUNTER — Ambulatory Visit: Payer: 59 | Admitting: Family Medicine

## 2020-06-22 ENCOUNTER — Ambulatory Visit: Payer: 59 | Admitting: Orthopaedic Surgery

## 2020-06-28 ENCOUNTER — Telehealth: Payer: Self-pay | Admitting: Neurology

## 2020-06-28 NOTE — Telephone Encounter (Signed)
Per pt she is taking the Rizatriptan and that not helping at all with this one particular headache.   Pt Would like to come by the office and get a headache cocktail. Pt to pick up emagilty sample at visit   [facility] desk please schedule the pr for tomorrow.    We will resend the PA with the information.

## 2020-06-28 NOTE — Telephone Encounter (Signed)
Per pt insurance they will not cover the [facility] unless she tried and failed these.  topirmate or Amitrpline.   Per Topirmate not helping at all.    Please advise.

## 2020-06-28 NOTE — Telephone Encounter (Signed)
Whatever needs to be done - she has failed topiramate and amitriptyline.  Otherwise, have her activate the copay card and she can come by the office for a sample to get started, also she can get a headache cocktail (she will need a driver).  Did she take the rizatriptan?  I gave her a higher dose so it migraines shouldn't be lasting longer than on the smaller dose.  If she comes by the office, we can give her samples of Ubrelvy 100mg  - take one tablet as needed, may repeat in 2 hours (maximum 2 tablets in 24 hours)

## 2020-06-28 NOTE — Telephone Encounter (Signed)
Patient called and said her insurance is not covering her shot (Emgality 120 mg) and she has "a headache from hell."   Patient further explained she is on her fourth day of a migraine. She'd like some help.  [facility] [address]

## 2020-06-29 ENCOUNTER — Other Ambulatory Visit: Payer: Self-pay

## 2020-06-29 ENCOUNTER — Ambulatory Visit (INDEPENDENT_AMBULATORY_CARE_PROVIDER_SITE_OTHER): Payer: 59

## 2020-06-29 DIAGNOSIS — G43109 Migraine with aura, not intractable, without status migrainosus: Secondary | ICD-10-CM | POA: Diagnosis not present

## 2020-06-29 MED ORDER — KETOROLAC TROMETHAMINE 60 MG/2ML IM SOLN
60.0000 mg | Freq: Once | INTRAMUSCULAR | Status: AC
  Administered 2020-06-29: 60 mg via INTRAMUSCULAR

## 2020-06-29 MED ORDER — METOCLOPRAMIDE HCL 5 MG/ML IJ SOLN
10.0000 mg | Freq: Once | INTRAMUSCULAR | Status: AC
  Administered 2020-06-29: 10 mg via INTRAMUSCULAR

## 2020-06-29 MED ORDER — DIPHENHYDRAMINE HCL 50 MG/ML IJ SOLN
25.0000 mg | Freq: Once | INTRAMUSCULAR | Status: AC
  Administered 2020-06-29: 25 mg via INTRAMUSCULAR

## 2020-06-29 NOTE — Progress Notes (Signed)
  Pt given a sample of Emgailty today until we can find out why she is unble to pick up at her pharmacy   Medication Samples have been provided to the patient.  Drug name: Emgality      Strength:120 mg     Qty: 1  LOT: [id number]  Exp.Date: 02/09/22  Dosing instructions:   The patient has been instructed regarding the correct time, dose, and frequency of taking this medication, including desired effects and most common side effects.   Venetia Night 3:23 PM 06/29/2020    [facility], Spoke to [place], [place] approved $15.    Pt advise and she will go pick up medication.

## 2020-07-05 ENCOUNTER — Ambulatory Visit
Admission: RE | Admit: 2020-07-05 | Discharge: 2020-07-05 | Disposition: A | Discharge status: Home / Self Care | Payer: 59 | Source: Ambulatory Visit | Attending: Family Medicine | Admitting: Family Medicine

## 2020-07-05 DIAGNOSIS — R1031 Right lower quadrant pain: Secondary | ICD-10-CM

## 2020-07-06 ENCOUNTER — Other Ambulatory Visit: Payer: Self-pay | Admitting: Family Medicine

## 2020-07-06 DIAGNOSIS — N3289 Other specified disorders of bladder: Secondary | ICD-10-CM

## 2020-07-09 ENCOUNTER — Other Ambulatory Visit: Payer: Self-pay

## 2020-07-09 ENCOUNTER — Ambulatory Visit: Payer: 59 | Admitting: Family Medicine

## 2020-07-16 ENCOUNTER — Other Ambulatory Visit: Payer: Self-pay | Admitting: Family Medicine

## 2020-07-16 DIAGNOSIS — E7800 Pure hypercholesterolemia, unspecified: Secondary | ICD-10-CM

## 2020-07-22 ENCOUNTER — Ambulatory Visit
Admission: RE | Admit: 2020-07-22 | Discharge: 2020-07-22 | Disposition: A | Discharge status: Home / Self Care | Payer: 59 | Source: Ambulatory Visit | Attending: Orthopaedic Surgery | Admitting: Orthopaedic Surgery

## 2020-07-22 ENCOUNTER — Other Ambulatory Visit: Payer: Self-pay | Admitting: Family Medicine

## 2020-07-22 ENCOUNTER — Other Ambulatory Visit: Payer: Self-pay

## 2020-07-22 DIAGNOSIS — M25512 Pain in left shoulder: Secondary | ICD-10-CM

## 2020-07-22 DIAGNOSIS — I10 Essential (primary) hypertension: Secondary | ICD-10-CM

## 2020-07-22 DIAGNOSIS — G8929 Other chronic pain: Secondary | ICD-10-CM

## 2020-07-22 DIAGNOSIS — R7303 Prediabetes: Secondary | ICD-10-CM

## 2020-07-22 MED ORDER — IOPAMIDOL (ISOVUE-M 200) INJECTION 41%
15.0000 mL | Freq: Once | INTRAMUSCULAR | Status: AC
  Administered 2020-07-22: 15 mL via INTRA_ARTICULAR

## 2020-07-29 ENCOUNTER — Ambulatory Visit: Payer: 59 | Admitting: Family Medicine

## 2020-08-02 ENCOUNTER — Ambulatory Visit (INDEPENDENT_AMBULATORY_CARE_PROVIDER_SITE_OTHER): Payer: 59 | Admitting: Family Medicine

## 2020-08-02 ENCOUNTER — Other Ambulatory Visit: Payer: Self-pay | Admitting: Family Medicine

## 2020-08-02 ENCOUNTER — Encounter: Payer: Self-pay | Admitting: Family Medicine

## 2020-08-02 ENCOUNTER — Other Ambulatory Visit: Payer: Self-pay

## 2020-08-02 ENCOUNTER — Telehealth: Payer: Self-pay | Admitting: Family Medicine

## 2020-08-02 VITALS — BP 102/68 | Temp 97.7°F | Ht 62.0 in | Wt 177.0 lb

## 2020-08-02 DIAGNOSIS — Z720 Tobacco use: Secondary | ICD-10-CM

## 2020-08-02 DIAGNOSIS — I10 Essential (primary) hypertension: Secondary | ICD-10-CM | POA: Diagnosis not present

## 2020-08-02 DIAGNOSIS — N3289 Other specified disorders of bladder: Secondary | ICD-10-CM

## 2020-08-02 DIAGNOSIS — R739 Hyperglycemia, unspecified: Secondary | ICD-10-CM

## 2020-08-02 DIAGNOSIS — E1165 Type 2 diabetes mellitus with hyperglycemia: Secondary | ICD-10-CM

## 2020-08-02 DIAGNOSIS — E7800 Pure hypercholesterolemia, unspecified: Secondary | ICD-10-CM | POA: Diagnosis not present

## 2020-08-02 DIAGNOSIS — T502X5A Adverse effect of carbonic-anhydrase inhibitors, benzothiadiazides and other diuretics, initial encounter: Secondary | ICD-10-CM | POA: Diagnosis not present

## 2020-08-02 DIAGNOSIS — E876 Hypokalemia: Secondary | ICD-10-CM

## 2020-08-02 MED ORDER — POTASSIUM CHLORIDE CRYS ER 20 MEQ PO TBCR: EXTENDED_RELEASE_TABLET | ORAL | 0 refills | Status: DC

## 2020-08-02 MED ORDER — POTASSIUM CHLORIDE CRYS ER 20 MEQ PO TBCR: EXTENDED_RELEASE_TABLET | ORAL | 2 refills | Status: DC

## 2020-08-02 NOTE — Telephone Encounter (Signed)
I offered to make pt a 2 week Potassium check at check out, she declined.

## 2020-08-02 NOTE — Progress Notes (Addendum)
Established Patient Office Visit  Subjective:  Patient ID: [name], female    DOB: 1971-01-30  Age: 50 y.o. MRN: [id number]  CC:  Chief Complaint  Patient presents with   Follow-up    3 month follow up on BP and DM. Patient would like to discuss labs.     HPI [name] presents for follow-up of hypertension, hypokalemia, elevated cholesterol with evidence of vascular disease and hyperglycemia.  Mildly elevated hemoglobin A1c and past lab work.  Patient has been taking her rosuvastatin as directed.  Diagnosed with hypokalemia last month and took potassium for 6 days.  Blood pressure is well controlled with chlorthalidone and lisinopril.  Discussed results of pelvic and abdominal ultrasounds that have been ordered for right lower quadrant pain when she was seen back at the end of April.  Skews make notes  Past Medical History:  Diagnosis Date   Back pain    GERD (gastroesophageal reflux disease)    High cholesterol    Hypertension    Migraine    Scoliosis     Past Surgical History:  Procedure Laterality Date   ABDOMINAL HYSTERECTOMY     ABDOMINAL SURGERY     CESAREAN SECTION     HAND SURGERY      Family History  Problem Relation Age of Onset   Hypertension Mother    Stroke Mother    Cancer Mother    Diabetes Mother    Heart attack Father     Social History   Socioeconomic History   Marital status: Married    Spouse name: Lucious   Number of children: 2   Years of education: 12th   Highest education level: Not on file  Occupational History   Occupation: Teacher, adult education: [employer]    Comment: [employer]  Tobacco Use   Smoking status: Every Day    Packs/day: 1.00    Years: 15.00    Pack years: 15.00    Types: Cigarettes   Smokeless tobacco: Never  Vaping Use   Vaping Use: Never used  Substance and Sexual Activity   Alcohol use: Yes    Comment: rarely   Drug use: No   Sexual activity: Not on file  Other Topics Concern   Not on  file  Social History Narrative   One story home   Caffeine use:7-8 cups daily (tea)   Right handed   Social Determinants of Health   Financial Resource Strain: Not on file  Food Insecurity: Not on file  Transportation Needs: Not on file  Physical Activity: Not on file  Stress: Not on file  Social Connections: Not on file  Intimate Partner Violence: Not on file    Outpatient Medications Prior to Visit  Medication Sig Dispense Refill   acetaminophen-codeine (TYLENOL #3) 300-30 MG tablet Take 1-2 tablets by mouth every 4 (four) hours as needed for moderate pain. 20 tablet 0   chlorthalidone (HYGROTON) 25 MG tablet TAKE 1 TABLET BY MOUTH EVERY DAY 90 tablet 1   Galcanezumab-gnlm (EMGALITY) 120 MG/ML SOAJ Inject 120 mg into the skin every 28 (twenty-eight) days. 1.12 mL 5   lisinopril (ZESTRIL) 40 MG tablet TAKE 1 TABLET BY MOUTH EVERY DAY 90 tablet 1   ondansetron (ZOFRAN ODT) 4 MG disintegrating tablet Take 1 tablet (4 mg total) by mouth every 8 (eight) hours as needed for nausea or vomiting. 20 tablet 5   oxybutynin (DITROPAN-XL) 5 MG 24 hr tablet TAKE 1 TABLET BY MOUTH  EVERYDAY AT BEDTIME 30 tablet 1   rizatriptan (MAXALT-MLT) 10 MG disintegrating tablet Take 1 tablet earliest onset of migraine.  May repeat in 2 hours if needed. Maximum 2 tablets 24 hours. 10 tablet 5   rosuvastatin (CRESTOR) 10 MG tablet TAKE 1 TABLET BY MOUTH EVERY DAY 90 tablet 2   meloxicam (MOBIC) 7.5 MG tablet Take 1 tablet (7.5 mg total) by mouth daily. With food (Patient not taking: Reported on 08/02/2020) 14 tablet 0   potassium chloride SA (KLOR-CON) 20 MEQ tablet Take 2 tablets (40 mEq total) by mouth daily. (Patient not taking: Reported on 08/02/2020) 6 tablet 0   No facility-administered medications prior to visit.    No Known Allergies  ROS Review of Systems  Constitutional: Negative.   HENT: Negative.    Respiratory: Negative.    Cardiovascular: Negative.   Gastrointestinal: Negative.    Endocrine: Negative for polyphagia and polyuria.  Neurological:  Positive for headaches.  Psychiatric/Behavioral: Negative.       Objective:    Physical Exam Vitals and nursing note reviewed.  Constitutional:      General: She is not in acute distress.    Appearance: Normal appearance. She is not ill-appearing, toxic-appearing or diaphoretic.  HENT:     Head: Normocephalic and atraumatic.     Right Ear: Tympanic membrane, ear canal and external ear normal.     Left Ear: Tympanic membrane, ear canal and external ear normal.     Mouth/Throat:     Mouth: Mucous membranes are moist.     Pharynx: Oropharynx is clear. No oropharyngeal exudate or posterior oropharyngeal erythema.  Eyes:     General: No scleral icterus.       Right eye: No discharge.        Left eye: No discharge.  Neck:     Vascular: No carotid bruit.  Cardiovascular:     Rate and Rhythm: Normal rate and regular rhythm.  Pulmonary:     Effort: Pulmonary effort is normal.     Breath sounds: Normal breath sounds.  Musculoskeletal:     Cervical back: No rigidity or tenderness.  Lymphadenopathy:     Cervical: No cervical adenopathy.  Skin:    General: Skin is warm and dry.  Neurological:     Mental Status: She is alert and oriented to person, place, and time.  Psychiatric:        Mood and Affect: Mood normal.        Behavior: Behavior normal.    BP 102/68   Temp 97.7 F (36.5 C) (Temporal)   Ht 5\' 2"  (1.575 m)   Wt 177 lb (80.3 kg)   BMI 32.37 kg/m  Wt Readings from Last 3 Encounters:  08/02/20 177 lb (80.3 kg)  06/14/20 177 lb (80.3 kg)  05/20/20 180 lb (81.6 kg)     Health Maintenance Due  Topic Date Due   PNEUMOCOCCAL POLYSACCHARIDE VACCINE AGE 85-64 HIGH RISK  Never done   Pneumococcal Vaccine 30-3 Years old (1 - PCV) Never done   FOOT EXAM  Never done   OPHTHALMOLOGY EXAM  Never done   Hepatitis C Screening  Never done   COLONOSCOPY (Pts 45-67yrs Insurance coverage will need to be confirmed)   Never done   MAMMOGRAM  03/26/2018   Zoster Vaccines- Shingrix (1 of 2) Never done    There are no preventive care reminders to display for this patient.  Lab Results  Component Value Date   TSH 0.45 08/26/2019  Lab Results  Component Value Date   WBC 6.0 06/14/2020   HGB 14.2 06/14/2020   HCT 41.9 06/14/2020   MCV 89.4 06/14/2020   PLT 285.0 06/14/2020   Lab Results  Component Value Date   NA 139 08/02/2020   K 3.0 (L) 08/02/2020   CO2 25 08/02/2020   GLUCOSE 131 (H) 08/02/2020   BUN 16 08/02/2020   CREATININE 0.83 08/02/2020   BILITOT 0.5 06/14/2020   ALKPHOS 87 06/14/2020   AST 17 06/14/2020   ALT 14 06/14/2020   PROT 7.3 06/14/2020   ALBUMIN 4.2 06/14/2020   CALCIUM 9.5 08/02/2020   ANIONGAP 13 10/08/2019   GFR 81.09 08/02/2020   Lab Results  Component Value Date   CHOL 155 04/26/2020   Lab Results  Component Value Date   HDL 44.10 04/26/2020   Lab Results  Component Value Date   LDLCALC 91 04/26/2020   Lab Results  Component Value Date   TRIG 98.0 04/26/2020   Lab Results  Component Value Date   CHOLHDL 4 04/26/2020   Lab Results  Component Value Date   HGBA1C 8.1 (H) 08/02/2020      Assessment & Plan:   Problem List Items Addressed This Visit       Cardiovascular and Mediastinum   Essential hypertension - Primary   Relevant Orders   Basic metabolic panel (Completed)     Endocrine   Type 2 diabetes mellitus with hyperglycemia, without long-term current use of insulin (HCC)   Relevant Medications   metFORMIN (GLUCOPHAGE XR) 500 MG 24 hr tablet     Other   Elevated cholesterol   Tobacco use   Diuretic-induced hypokalemia   Relevant Medications   potassium chloride SA (KLOR-CON) 20 MEQ tablet   Other Relevant Orders   Basic metabolic panel (Completed)   Potassium   Hyperglycemia   Relevant Orders   Hemoglobin A1c (Completed)    Meds ordered this encounter  Medications   DISCONTD: potassium chloride SA (KLOR-CON) 20 MEQ  tablet    Sig: Take one twice daily for 1 week and then one daily.    Dispense:  37 tablet    Refill:  0   potassium chloride SA (KLOR-CON) 20 MEQ tablet    Sig: Take one twice daily for 1 week and then one daily.    Dispense:  37 tablet    Refill:  2   metFORMIN (GLUCOPHAGE XR) 500 MG 24 hr tablet    Sig: Take 1 tablet (500 mg total) by mouth daily with breakfast.    Dispense:  90 tablet    Refill:  0    Follow-up: Return in about 3 months (around 11/02/2020), or take potassium tablet as directed. Return in 2 weeks for repeat potassium check..  Again, explained the importance of taking prescribed potassium as directed with diuretic.  May need to discontinue chlorthalidone.  Rechecking hemoglobin A1c.  Emphasized the importance of taking rosuvastatin daily with evidence of vascular disease.  Encourage smoking cessation and will discuss Chantix next visit.  Libby Maw, MD

## 2020-08-03 ENCOUNTER — Ambulatory Visit (INDEPENDENT_AMBULATORY_CARE_PROVIDER_SITE_OTHER): Payer: 59 | Admitting: Orthopaedic Surgery

## 2020-08-03 ENCOUNTER — Encounter: Payer: Self-pay | Admitting: Orthopaedic Surgery

## 2020-08-03 ENCOUNTER — Other Ambulatory Visit: Payer: Self-pay | Admitting: Family Medicine

## 2020-08-03 DIAGNOSIS — G43109 Migraine with aura, not intractable, without status migrainosus: Secondary | ICD-10-CM

## 2020-08-03 DIAGNOSIS — G8929 Other chronic pain: Secondary | ICD-10-CM

## 2020-08-03 DIAGNOSIS — M25512 Pain in left shoulder: Secondary | ICD-10-CM | POA: Diagnosis not present

## 2020-08-03 LAB — BASIC METABOLIC PANEL
BUN: 16 mg/dL (ref 6–23)
CO2: 25 mEq/L (ref 19–32)
Calcium: 9.5 mg/dL (ref 8.4–10.5)
Chloride: 104 mEq/L (ref 96–112)
Creatinine, Ser: 0.83 mg/dL (ref 0.40–1.20)
GFR: 81.09 mL/min (ref >60.00)
Glucose, Bld: 131 mg/dL — ABNORMAL HIGH (ref 70–99)
Potassium: 3 mEq/L — ABNORMAL LOW (ref 3.5–5.1)
Sodium: 139 mEq/L (ref 135–145)

## 2020-08-03 LAB — HEMOGLOBIN A1C: Hgb A1c MFr Bld: 8.1 % — ABNORMAL HIGH (ref 4.6–6.5)

## 2020-08-03 NOTE — Progress Notes (Signed)
Office Visit Note   Patient: [name]           Date of Birth: 1970/11/25           MRN: [id number] Visit Date: 08/03/2020              Requested by: Libby Maw, MD [address] [place],  [place] [address] PCP: Libby Maw, MD   Assessment & Plan: Visit Diagnoses:  1. Chronic left shoulder pain     Plan: MRI shows moderate tendinosis with a high-grade partial thickness tear and severe tendinosis of the infraspinatus tendon.  Overall her exam and findings are more consistent with adhesive capsulitis.  Unfortunately the subacromial injection that I performed a few months ago did not help but I did recommend trying an intra-articular injection at this point with a referral to outpatient physical therapy.  She is agreeable to this and I would like to recheck her in 8 weeks.  In the meantime she will make an appointment to see Dr. Junius Roads for an ultrasound-guided shoulder injection.  Follow-Up Instructions: Return in about 8 weeks (around 09/28/2020) for needs left shoulder GH injection with Dr. Junius Roads.   Orders:  Orders Placed This Encounter  Procedures   Ambulatory referral to Physical Therapy   No orders of the defined types were placed in this encounter.     Procedures: No procedures performed   Clinical Data: No additional findings.   Subjective: Chief Complaint  Patient presents with   Left Shoulder - Pain, Follow-up    Azarya returns today for MRI review of the left shoulder.  She still reports pain but feels like the range of motion may be slightly better.  Briefly this all started back in December when she had her COVID-vaccine and booster.   Review of Systems   Objective: Vital Signs: There were no vitals taken for this visit.  Physical Exam  Ortho Exam Left shoulder shows moderate limitation in active and passive abduction, forward flexion, external rotation and internal rotation consistent with adhesive capsulitis.  Manual  muscle testing is limited secondary to pain. Specialty Comments:  No specialty comments available.  Imaging: No results found.   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   Past Medical History:  Diagnosis Date   Back pain    GERD (gastroesophageal reflux disease)    High cholesterol    Hypertension    Migraine    Scoliosis     Family History  Problem Relation Age of Onset   Hypertension Mother    Stroke Mother    Cancer Mother    Diabetes Mother    Heart attack Father     Past Surgical History:  Procedure Laterality Date   ABDOMINAL HYSTERECTOMY     ABDOMINAL SURGERY     CESAREAN SECTION     HAND SURGERY     Social History   Occupational History   Occupation: Teacher, 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

## 2020-08-03 NOTE — Telephone Encounter (Signed)
Requested topamax. Dr. Tomi Likens had discontinued it.

## 2020-08-04 ENCOUNTER — Telehealth: Payer: Self-pay | Admitting: Neurology

## 2020-08-04 NOTE — Telephone Encounter (Signed)
Appointment scheduled for 2 weeks potassium check

## 2020-08-05 ENCOUNTER — Telehealth: Payer: Self-pay

## 2020-08-05 ENCOUNTER — Other Ambulatory Visit: Payer: Self-pay

## 2020-08-05 NOTE — Telephone Encounter (Signed)
Patient is taken this medication of topamax is not discontinued per patietn.does she need to take it, she has been taken it faithfully.

## 2020-08-05 NOTE — Telephone Encounter (Signed)
Sent to [facility] to sign

## 2020-08-06 ENCOUNTER — Other Ambulatory Visit: Payer: Self-pay

## 2020-08-06 MED ORDER — TOPIRAMATE 25 MG PO TABS: 50.0000 mg | ORAL_TABLET | Freq: Two times a day (BID) | ORAL | 1 refills | Status: DC

## 2020-08-08 DIAGNOSIS — E1165 Type 2 diabetes mellitus with hyperglycemia: Secondary | ICD-10-CM | POA: Insufficient documentation

## 2020-08-08 MED ORDER — METFORMIN HCL ER 500 MG PO TB24: 500.0000 mg | ORAL_TABLET | Freq: Every day | ORAL | 0 refills | Status: DC

## 2020-08-08 NOTE — Addendum Note (Signed)
Addended by: Jon Billings on: 08/08/2020 06:47 PM   Modules accepted: Orders

## 2020-08-09 ENCOUNTER — Ambulatory Visit: Payer: Self-pay

## 2020-08-09 ENCOUNTER — Ambulatory Visit (INDEPENDENT_AMBULATORY_CARE_PROVIDER_SITE_OTHER): Payer: 59 | Admitting: Family Medicine

## 2020-08-09 ENCOUNTER — Other Ambulatory Visit: Payer: Self-pay

## 2020-08-09 DIAGNOSIS — G8929 Other chronic pain: Secondary | ICD-10-CM | POA: Diagnosis not present

## 2020-08-09 DIAGNOSIS — M25512 Pain in left shoulder: Secondary | ICD-10-CM | POA: Diagnosis not present

## 2020-08-09 MED ORDER — TRAMADOL HCL 50 MG PO TABS: 50.0000 mg | ORAL_TABLET | Freq: Four times a day (QID) | ORAL | 0 refills | Status: DC | PRN

## 2020-08-09 NOTE — Progress Notes (Signed)
Subjective: Patient is here for ultrasound-guided intra-articular left glenohumeral injection.  She has pain for the past 5 or 6 months since getting a COVID booster.  She has developed adhesive capsulitis along with rotator cuff tendinopathy.  Subacromial injection did not help.  Objective: Very limited active overhead reach.  Procedure: Ultrasound guided injection is preferred based studies that show increased duration, increased effect, greater accuracy, decreased procedural pain, increased response rate, and decreased cost with ultrasound guided versus blind injection.   Verbal informed consent obtained.  Time-out conducted.  Noted no overlying erythema, induration, or other signs of local infection. Ultrasound-guided left glenohumeral injection: After sterile prep with Betadine, injected 4 cc 0.25% bupivocaine without epinephrine and 6 mg betamethasone using a 22-gauge spinal needle, passing the needle from posterior approach into the glenohumeral joint.  Injectate seen filling the joint capsule.

## 2020-08-12 ENCOUNTER — Telehealth: Payer: Self-pay

## 2020-08-12 NOTE — Telephone Encounter (Signed)
Pt calling to inform the provider that she can not work taking the Metformin rx.  Pt explains that it is causing her to use the restroom too much.  Pt is requesting that the provider send something else in for her.  Please advise.  CB#[phone]

## 2020-08-12 NOTE — Telephone Encounter (Signed)
Please advice message below

## 2020-08-16 ENCOUNTER — Ambulatory Visit (INDEPENDENT_AMBULATORY_CARE_PROVIDER_SITE_OTHER): Payer: 59 | Admitting: Family Medicine

## 2020-08-16 ENCOUNTER — Other Ambulatory Visit: Payer: 59

## 2020-08-16 ENCOUNTER — Other Ambulatory Visit: Payer: Self-pay

## 2020-08-16 VITALS — BP 118/68 | HR 67 | Temp 97.4°F | Ht 62.0 in | Wt 175.0 lb

## 2020-08-16 DIAGNOSIS — T50905A Adverse effect of unspecified drugs, medicaments and biological substances, initial encounter: Secondary | ICD-10-CM | POA: Diagnosis not present

## 2020-08-16 DIAGNOSIS — E1165 Type 2 diabetes mellitus with hyperglycemia: Secondary | ICD-10-CM

## 2020-08-16 DIAGNOSIS — T502X5A Adverse effect of carbonic-anhydrase inhibitors, benzothiadiazides and other diuretics, initial encounter: Secondary | ICD-10-CM

## 2020-08-16 DIAGNOSIS — E876 Hypokalemia: Secondary | ICD-10-CM

## 2020-08-16 LAB — POTASSIUM: Potassium: 3.7 mEq/L (ref 3.5–5.1)

## 2020-08-16 MED ORDER — RYBELSUS 3 MG PO TABS: 3.0000 mg | ORAL_TABLET | Freq: Every day | ORAL | 0 refills | Status: DC

## 2020-08-16 MED ORDER — RYBELSUS 7 MG PO TABS: 7.0000 mg | ORAL_TABLET | Freq: Every day | ORAL | 2 refills | Status: DC

## 2020-08-16 NOTE — Telephone Encounter (Signed)
Patient seen by Dr Gena Fray to discuss medication.  Dm/cma

## 2020-08-16 NOTE — Progress Notes (Signed)
[facility] [address] [place] [address] Dept: [phone] Dept Fax: [phone]  Office Visit  Subjective:    Patient ID: [name], female    DOB: 1970/12/27, 50 y.o..   MRN: [id number]  Chief Complaint  Patient presents with   Acute Visit    C/o haivng nausea, abdominal cramping, no appetite, fatigue since starting Metformin 08/08/20.      History of Present Illness:  Patient is in today complaining of crampy abdominal pain, nausea, and diarrhea since starting metformin last Wednesday. She also feels frustrated that she has not seen her blood sugars come down yet. She was able to obtain a home glucose monitor and notes her blood sugars are still around 200. She has been pushing fluids in hopes that he sugars will come down.   Past Medical History: Patient Active Problem List   Diagnosis Date Noted   Type 2 diabetes mellitus with hyperglycemia, without long-term current use of insulin ([place]) 08/08/2020   Diuretic-induced hypokalemia 06/15/2020   Aortic atherosclerosis ([place]) 04/26/2020   Right lower quadrant abdominal pain 10/08/2019   Elevated cholesterol 08/22/2019   Essential hypertension 08/22/2019   Tobacco use 08/22/2019   Migraine with aura and without status migrainosus, not intractable 07/08/2013   Analgesic overuse headache 07/08/2013   Past Surgical History:  Procedure Laterality Date   ABDOMINAL HYSTERECTOMY     ABDOMINAL SURGERY     CESAREAN SECTION     HAND SURGERY     Family History  Problem Relation Age of Onset   Hypertension Mother    Stroke Mother    Cancer Mother    Diabetes Mother    Heart attack Father    Outpatient Medications Prior to Visit  Medication Sig Dispense Refill   acetaminophen-codeine (TYLENOL #3) 300-30 MG tablet Take 1-2 tablets by mouth every 4 (four) hours as needed for moderate pain. 20 tablet 0   chlorthalidone (HYGROTON) 25 MG tablet TAKE 1 TABLET BY MOUTH  EVERY DAY 90 tablet 1   Galcanezumab-gnlm (EMGALITY) 120 MG/ML SOAJ Inject 120 mg into the skin every 28 (twenty-eight) days. 1.12 mL 5   lisinopril (ZESTRIL) 40 MG tablet TAKE 1 TABLET BY MOUTH EVERY DAY 90 tablet 1   meloxicam (MOBIC) 7.5 MG tablet Take 1 tablet (7.5 mg total) by mouth daily. With food 14 tablet 0   ondansetron (ZOFRAN ODT) 4 MG disintegrating tablet Take 1 tablet (4 mg total) by mouth every 8 (eight) hours as needed for nausea or vomiting. 20 tablet 5   oxybutynin (DITROPAN-XL) 5 MG 24 hr tablet TAKE 1 TABLET BY MOUTH EVERYDAY AT BEDTIME 30 tablet 1   potassium chloride SA (KLOR-CON) 20 MEQ tablet Take one twice daily for 1 week and then one daily. 37 tablet 2   rizatriptan (MAXALT-MLT) 10 MG disintegrating tablet Take 1 tablet earliest onset of migraine.  May repeat in 2 hours if needed. Maximum 2 tablets 24 hours. 10 tablet 5   rosuvastatin (CRESTOR) 10 MG tablet TAKE 1 TABLET BY MOUTH EVERY DAY 90 tablet 2   topiramate (TOPAMAX) 25 MG tablet Take 2 tablets (50 mg total) by mouth 2 (two) times daily. 120 tablet 1   traMADol (ULTRAM) 50 MG tablet Take 1 tablet (50 mg total) by mouth every 6 (six) hours as needed. 20 tablet 0   metFORMIN (GLUCOPHAGE XR) 500 MG 24 hr tablet Take 1 tablet (500 mg total) by mouth daily with breakfast. 90 tablet 0  No facility-administered medications prior to visit.   Allergies  Allergen Reactions   Metformin And Related Diarrhea and Nausea And Vomiting   Objective:   Today's Vitals   08/16/20 1059  BP: 118/68  Pulse: 67  Temp: (!) 97.4 F (36.3 C)  TempSrc: Temporal  SpO2: 97%  Weight: 175 lb (79.4 kg)  Height: 5\' 2"  (1.575 m)   Body mass index is 32.01 kg/m.   General: Well developed, well nourished. No acute distress. Psych: Alert and oriented x3. Normal mood and affect.  Health Maintenance Due  Topic Date Due   PNEUMOCOCCAL POLYSACCHARIDE VACCINE AGE 64-64 HIGH RISK  Never done   Pneumococcal Vaccine 74-8 Years old (1 -  PCV) Never done   FOOT EXAM  Never done   OPHTHALMOLOGY EXAM  Never done   Hepatitis C Screening  Never done   COLONOSCOPY (Pts 45-71yrs Insurance coverage will need to be confirmed)  Never done   MAMMOGRAM  03/26/2018   Zoster Vaccines- Shingrix (1 of 2) Never done     Assessment & Plan:   1. Type 2 diabetes mellitus with hyperglycemia, without long-term current use of insulin ([place]) 2. Medication side effects, initial encounter Ms. Lamarre appears to be having GI intolerance of her metformin. I did discuss with her about how oral medications will gradually improve her blood sugars, rather than rapidly bring them into a  normal range. I recommend we switch her to a GLP-1 RA, as this can also lead to weight loss. She should continue to push fluids. I will refer her for diabetes teaching. I recommended she check her fasting blood sugars daily, record these, and bring them with her to her follow-up appointment in 4 weeks..  - Ambulatory referral to diabetic education - Semaglutide (RYBELSUS) 3 MG TABS; Take 3 mg by mouth daily.  Dispense: 30 tablet; Refill: 0 - Semaglutide (RYBELSUS) 7 MG TABS; Take 7 mg by mouth daily. To start after completing 30 days of the 3 mg Rybelsus.  Dispense: 30 tablet; Refill: 2  Haydee Salter, MD

## 2020-08-17 ENCOUNTER — Ambulatory Visit: Payer: 59 | Admitting: Family Medicine

## 2020-08-24 ENCOUNTER — Other Ambulatory Visit: Payer: Self-pay | Admitting: Family Medicine

## 2020-08-24 DIAGNOSIS — N3289 Other specified disorders of bladder: Secondary | ICD-10-CM

## 2020-08-25 ENCOUNTER — Ambulatory Visit: Payer: 59 | Attending: Orthopaedic Surgery

## 2020-08-25 ENCOUNTER — Other Ambulatory Visit: Payer: Self-pay

## 2020-08-25 DIAGNOSIS — M25512 Pain in left shoulder: Secondary | ICD-10-CM | POA: Insufficient documentation

## 2020-08-25 DIAGNOSIS — M6281 Muscle weakness (generalized): Secondary | ICD-10-CM | POA: Diagnosis present

## 2020-08-25 DIAGNOSIS — M25612 Stiffness of left shoulder, not elsewhere classified: Secondary | ICD-10-CM | POA: Insufficient documentation

## 2020-08-26 NOTE — Therapy (Signed)
[facility] [place], [place], [address] Phone: [phone]   Fax:  [phone]  Physical Therapy Evaluation  Patient Details  Name: [name] MRN: [id number] Date of Birth: 1970-12-23 Referring Provider (PT): Leandrew Koyanagi, MD   Encounter Date: 08/25/2020   PT End of Session - 08/26/20 1234     Visit Number 1    Number of Visits 17    Date for PT Re-Evaluation 10/21/20    Authorization Type [employer]: FOTO 6th and 10th    PT Start Time 1610    PT Stop Time 1650    PT Time Calculation (min) 40 min    Activity Tolerance Patient limited by pain    Behavior During Therapy [facility] for tasks assessed/performed             Past Medical History:  Diagnosis Date   Back pain    GERD (gastroesophageal reflux disease)    High cholesterol    Hypertension    Migraine    Scoliosis     Past Surgical History:  Procedure Laterality Date   ABDOMINAL HYSTERECTOMY     ABDOMINAL SURGERY     CESAREAN SECTION     HAND SURGERY      There were no vitals filed for this visit.    Subjective Assessment - 08/26/20 1233     Subjective Pt presents to PT with reports of L shoulder pain and limited ROM since December 2021 since getting covid booster shot in L arm. She recently recieved two cortisone injections with no help. Pt is R hand dominant and has had sharp increase in difficulty with ADLs, dressing, and work. Has also had decrease in functional abiltiy to perform work related duties at [employer].    Pertinent History L shoulder pain and stiffness since covid booster shot in Dec 2021    Currently in Pain? No/denies    Pain Score 0-No pain   10/10 at worst in last week   Pain Location Shoulder    Pain Orientation Left    Pain Descriptors / Indicators Sharp    Pain Onset More than a month ago    Pain Frequency Intermittent    Aggravating Factors  reaching, laying on L side    Pain Relieving  Factors massage, rubbing alcohol                [facility] PT Assessment - 08/26/20 0001       Assessment   Medical Diagnosis M25.512,G89.29 (ICD-10-CM) - Chronic left shoulder pain    Referring Provider (PT) Leandrew Koyanagi, MD    Hand Dominance Right      Precautions   Precautions None      Restrictions   Weight Bearing Restrictions No      Balance Screen   Has the patient fallen in the past 6 months No    Has the patient had a decrease in activity level because of a fear of falling?  No    Is the patient reluctant to leave their home because of a fear of falling?  No      Home Environment   Living Environment Private residence    Living Arrangements Spouse/significant other;Children    Type of [place] to enter    [place] level      Prior Function   Level of Independence Independent;Independent with basic ADLs      AROM  Overall AROM Comments R UE WNL    Left Shoulder Flexion 60 Degrees    Left Shoulder ABduction 48 Degrees    Left Shoulder Internal Rotation --   DNT - pain   Left Shoulder External Rotation --   DNT - pain     Strength   Overall Strength Comments R UE WFL; L UE DNT d/t pain    Right Hand Grip (lbs) 40    Left Hand Grip (lbs) 30                        Objective measurements completed on examination: See above findings.       [facility] Adult PT Treatment/Exercise - 08/26/20 0001       Exercises   Exercises Shoulder      Shoulder Exercises: Seated   Retraction 10 reps    Row 10 reps;Theraband    Theraband Level (Shoulder Row) Level 2 (Red)      Shoulder Exercises: Stretch   Table Stretch -Flexion Limitations x 10 - 5 sec hold    Table Stretch - ABduction Limitations x 10 - 5 sec hold (scaption)                    PT Education - 08/25/20 1808     Education Details eval findings, HEP, POC    Person(s) Educated Patient    Methods Explanation;Demonstration;Handout    Comprehension  Verbalized understanding;Returned demonstration              PT Short Term Goals - 08/26/20 1235       PT SHORT TERM GOAL #1   Title Pt will be compliant and knowledgeable with initial HEP in order to improve carryover    Baseline intial HEP given    Time 3    Period Weeks    Status New    Target Date 09/16/20      PT SHORT TERM GOAL #2   Title Pt will increase L shoulder flex/abd AROM to no less than 100 degrees in order to improve functional ability    Baseline see flowsheet    Time 3    Period Weeks    Status New    Target Date 09/16/20               PT Long Term Goals - 08/26/20 1236       PT LONG TERM GOAL #1   Title Pt will improve FOTO score to no less than 60% in as a proxy for funtional improvement    Baseline 32% function, 58% predicted    Time 8    Period Weeks    Status New    Target Date 10/21/20      PT LONG TERM GOAL #2   Title Pt will increase L shoulder flex/abd to no less than 140 degrees in order to improve functional ability to perform ADLs    Baseline see flowsheet    Time 8    Period Weeks    Status New    Target Date 10/21/20      PT LONG TERM GOAL #3   Title Pt will self report L shoulder pain on greater than 4/10 at worst in order to improve comfort and functional ability    Baseline 10/10 at wrost    Time 8    Period Weeks    Status New    Target Date 10/21/20      PT LONG TERM  GOAL #4   Title Pt will be able to reach into cabinets not limited by L shoulder pain to improve ability    Baseline unable    Time 8    Period Weeks    Status New    Target Date 10/21/20      PT LONG TERM GOAL #5   Title Pt will improve L hand grip strength to 40lbs for improved functional ability    Baseline 30lbs    Time 8    Period Weeks    Status New    Target Date 10/21/20                    Plan - 08/26/20 1243     Clinical Impression Statement Pt is a 50 y/o F who presents to PT with reports of roughly 7 month history  of L shoulder pain and limited ROM. Physical findings are consistent with pt subjective impression, as pt demonstrates marked decrease in L shoulder ROM and apprehension with strength testing. Her FOTO score shows sharp decrease in functional ability compared to predicted level and PLOF. Due to signs and symptoms and reported difficulties in performing ADLs and work duties, pt would benefit from skilled PT services working on improving strength of dynamic stabilizers and L shoulder ROM. Progress may be limited d/t pt apprehension and significant decreases in ROM.    Personal Factors and Comorbidities Comorbidity 1;Time since onset of injury/illness/exacerbation;Fitness    Comorbidities HTN    Examination-Activity Limitations Carry;Reach Overhead;Lift    Examination-Participation Restrictions Yard Work;Volunteer;Occupation;Community Activity;Driving;Meal Prep    Stability/Clinical Decision Making Stable/Uncomplicated    Clinical Decision Making Low    Rehab Potential Good    PT Frequency 2x / week    PT Duration 8 weeks    PT Treatment/Interventions ADLs/Self Care Home Management;Electrical Stimulation;Cryotherapy;Moist Heat;Functional mobility training;Therapeutic activities;Therapeutic exercise;Neuromuscular re-education;Manual techniques;Passive range of motion;Dry needling;Taping    PT Next Visit Plan assess response to HEP; assess IR/ER for L UE; progress as able    PT Home Exercise Plan Access Code: [email or web address]    Consulted and Agree with Plan of Care Patient             Patient will benefit from skilled therapeutic intervention in order to improve the following deficits and impairments:  Decreased activity tolerance, Decreased endurance, Decreased mobility, Decreased range of motion, Decreased strength, Hypomobility, Impaired flexibility, Impaired UE functional use, Pain  Visit Diagnosis: Stiffness of left shoulder, not elsewhere classified - Plan: PT plan of care cert/re-cert  Muscle  weakness (generalized) - Plan: PT plan of care cert/re-cert  Left shoulder pain, unspecified chronicity - Plan: PT plan of care cert/re-cert     Problem List Patient Active Problem List   Diagnosis Date Noted   Type 2 diabetes mellitus with hyperglycemia, without long-term current use of insulin ([place]) 08/08/2020   Diuretic-induced hypokalemia 06/15/2020   Aortic atherosclerosis ([place]) 04/26/2020   Right lower quadrant abdominal pain 10/08/2019   Elevated cholesterol 08/22/2019   Essential hypertension 08/22/2019   Tobacco use 08/22/2019   Migraine with aura and without status migrainosus, not intractable 07/08/2013   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 [place], [place], [address] Phone: [phone]   Fax:  [phone]  Name: RACHE KLIMASZEWSKI MRN: [id number] Date of Birth: 1971-01-06

## 2020-08-31 ENCOUNTER — Other Ambulatory Visit: Payer: Self-pay | Admitting: Neurology

## 2020-08-31 ENCOUNTER — Ambulatory Visit: Payer: 59

## 2020-09-07 ENCOUNTER — Ambulatory Visit: Payer: 59

## 2020-09-16 ENCOUNTER — Other Ambulatory Visit: Payer: Self-pay

## 2020-09-17 ENCOUNTER — Encounter: Payer: Self-pay | Admitting: Family Medicine

## 2020-09-17 ENCOUNTER — Ambulatory Visit (INDEPENDENT_AMBULATORY_CARE_PROVIDER_SITE_OTHER): Payer: 59 | Admitting: Family Medicine

## 2020-09-17 VITALS — BP 106/70 | HR 70 | Temp 97.7°F | Ht 62.0 in | Wt 175.6 lb

## 2020-09-17 DIAGNOSIS — E876 Hypokalemia: Secondary | ICD-10-CM | POA: Diagnosis not present

## 2020-09-17 DIAGNOSIS — I10 Essential (primary) hypertension: Secondary | ICD-10-CM

## 2020-09-17 DIAGNOSIS — E1165 Type 2 diabetes mellitus with hyperglycemia: Secondary | ICD-10-CM | POA: Diagnosis not present

## 2020-09-17 DIAGNOSIS — Z720 Tobacco use: Secondary | ICD-10-CM

## 2020-09-17 DIAGNOSIS — T502X5A Adverse effect of carbonic-anhydrase inhibitors, benzothiadiazides and other diuretics, initial encounter: Secondary | ICD-10-CM

## 2020-09-17 NOTE — Progress Notes (Addendum)
Established Patient Office Visit  Subjective:  Patient ID: [name], female    DOB: Oct 31, 1970  Age: 50 y.o. MRN: [id number]  CC:  Chief Complaint  Patient presents with   Follow-up    Follow up on DM patient concerned that glucose levels at home will not go below 150.     HPI SARAHY HILLEY presents for follow-up of diabetes, hypertension and hypokalemia.  She has not tolerated metformin at all even though she had been given the extended release formula.  She is doing well with the Rybelsus.  She takes it first thing in the morning on a fasting stomach.  She will start the 7 mg pill in the morning.  She is having no issues taking it.  She has cut out all sugary drinks.  She achieves [phone] steps on her job.  Has not been able to go to diabetic teaching because of work scheduling.  It has been difficult for her to achieve a fasting blood sugar in the less than 150 range.  Past Medical History:  Diagnosis Date   Back pain    GERD (gastroesophageal reflux disease)    High cholesterol    Hypertension    Migraine    Scoliosis     Past Surgical History:  Procedure Laterality Date   ABDOMINAL HYSTERECTOMY     ABDOMINAL SURGERY     CESAREAN SECTION     HAND SURGERY      Family History  Problem Relation Age of Onset   Hypertension Mother    Stroke Mother    Cancer Mother    Diabetes Mother    Heart attack Father     Social History   Socioeconomic History   Marital status: Married    Spouse name: Lucious   Number of children: 2   Years of education: 12th   Highest education level: Not on file  Occupational History   Occupation: Teacher, adult education: [employer]    Comment: [place]  Tobacco Use   Smoking status: Every Day    Packs/day: 1.00    Years: 15.00    Pack years: 15.00    Types: Cigarettes   Smokeless tobacco: Never  Vaping Use   Vaping Use: Never used  Substance and Sexual Activity   Alcohol use: Yes    Comment: rarely   Drug use: No    Sexual activity: Not on file  Other Topics Concern   Not on file  Social History Narrative   One story home   Caffeine use:7-8 cups daily (tea)   Right handed   Social Determinants of Health   Financial Resource Strain: Not on file  Food Insecurity: Not on file  Transportation Needs: Not on file  Physical Activity: Not on file  Stress: Not on file  Social Connections: Not on file  Intimate Partner Violence: Not on file    Outpatient Medications Prior to Visit  Medication Sig Dispense Refill   acetaminophen-codeine (TYLENOL #3) 300-30 MG tablet Take 1-2 tablets by mouth every 4 (four) hours as needed for moderate pain. 20 tablet 0   Galcanezumab-gnlm (EMGALITY) 120 MG/ML SOAJ Inject 120 mg into the skin every 28 (twenty-eight) days. 1.12 mL 5   lisinopril (ZESTRIL) 40 MG tablet TAKE 1 TABLET BY MOUTH EVERY DAY 90 tablet 1   meloxicam (MOBIC) 7.5 MG tablet Take 1 tablet (7.5 mg total) by mouth daily. With food 14 tablet 0   oxybutynin (DITROPAN-XL) 5 MG 24 hr tablet  TAKE 1 TABLET BY MOUTH EVERYDAY AT BEDTIME 30 tablet 1   potassium chloride SA (KLOR-CON) 20 MEQ tablet Take one twice daily for 1 week and then one daily. 37 tablet 2   rizatriptan (MAXALT-MLT) 10 MG disintegrating tablet Take 1 tablet earliest onset of migraine.  May repeat in 2 hours if needed. Maximum 2 tablets 24 hours. 10 tablet 5   rosuvastatin (CRESTOR) 10 MG tablet TAKE 1 TABLET BY MOUTH EVERY DAY 90 tablet 2   Semaglutide (RYBELSUS) 3 MG TABS Take 3 mg by mouth daily. 30 tablet 0   Semaglutide (RYBELSUS) 7 MG TABS Take 7 mg by mouth daily. To start after completing 30 days of the 3 mg Rybelsus. 30 tablet 2   topiramate (TOPAMAX) 25 MG tablet Take 2 tablets (50 mg total) by mouth 2 (two) times daily. 120 tablet 1   traMADol (ULTRAM) 50 MG tablet Take 1 tablet (50 mg total) by mouth every 6 (six) hours as needed. 20 tablet 0   chlorthalidone (HYGROTON) 25 MG tablet TAKE 1 TABLET BY MOUTH EVERY DAY 90 tablet 1    ondansetron (ZOFRAN ODT) 4 MG disintegrating tablet Take 1 tablet (4 mg total) by mouth every 8 (eight) hours as needed for nausea or vomiting. 20 tablet 5   No facility-administered medications prior to visit.    Allergies  Allergen Reactions   Metformin And Related Diarrhea and Nausea And Vomiting    ROS Review of Systems  Constitutional: Negative.   HENT: Negative.    Eyes:  Negative for photophobia and visual disturbance.  Respiratory: Negative.    Cardiovascular: Negative.   Gastrointestinal: Negative.   Endocrine: Negative for polyphagia and polyuria.  Neurological: Negative.   Psychiatric/Behavioral: Negative.       Objective:    Physical Exam Constitutional:      General: She is not in acute distress.    Appearance: Normal appearance. She is not ill-appearing, toxic-appearing or diaphoretic.  HENT:     Head: Normocephalic and atraumatic.     Right Ear: Tympanic membrane, ear canal and external ear normal.     Left Ear: Tympanic membrane, ear canal and external ear normal.  Eyes:     General: No scleral icterus.       Right eye: No discharge.        Left eye: No discharge.     Extraocular Movements: Extraocular movements intact.     Conjunctiva/sclera: Conjunctivae normal.     Pupils: Pupils are equal, round, and reactive to light.  Neck:     Vascular: No carotid bruit.  Cardiovascular:     Rate and Rhythm: Normal rate and regular rhythm.  Pulmonary:     Effort: Pulmonary effort is normal.     Breath sounds: Normal breath sounds.  Musculoskeletal:     Cervical back: No rigidity or tenderness.  Lymphadenopathy:     Cervical: No cervical adenopathy.  Skin:    General: Skin is warm and dry.  Neurological:     Mental Status: She is alert and oriented to person, place, and time.  Psychiatric:        Mood and Affect: Mood normal.        Behavior: Behavior normal.    BP 106/70 (BP Location: Left Arm, Patient Position: Sitting, Cuff Size: Normal)   Pulse 70  Comment: manual  Temp 97.7 F (36.5 C) (Temporal)   Ht '5\' 2"'$  (1.575 m)   Wt 175 lb 9.6 oz (79.7 kg)   BMI 32.12  kg/m  Wt Readings from Last 3 Encounters:  09/17/20 175 lb 9.6 oz (79.7 kg)  08/16/20 175 lb (79.4 kg)  08/02/20 177 lb (80.3 kg)     Health Maintenance Due  Topic Date Due   PNEUMOCOCCAL POLYSACCHARIDE VACCINE AGE 31-64 HIGH RISK  Never done   Pneumococcal Vaccine 30-36 Years old (1 - PCV) Never done   FOOT EXAM  Never done   OPHTHALMOLOGY EXAM  Never done   Hepatitis C Screening  Never done   COLONOSCOPY (Pts 45-86yr Insurance coverage will need to be confirmed)  Never done   MAMMOGRAM  03/26/2018   Zoster Vaccines- Shingrix (1 of 2) Never done   INFLUENZA VACCINE  09/20/2020    There are no preventive care reminders to display for this patient.  Lab Results  Component Value Date   TSH 0.45 08/26/2019   Lab Results  Component Value Date   WBC 6.0 06/14/2020   HGB 14.2 06/14/2020   HCT 41.9 06/14/2020   MCV 89.4 06/14/2020   PLT 285.0 06/14/2020   Lab Results  Component Value Date   NA 139 09/17/2020   K 3.2 (L) 09/17/2020   CO2 24 09/17/2020   GLUCOSE 112 (H) 09/17/2020   BUN 18 09/17/2020   CREATININE 0.72 09/17/2020   BILITOT 0.5 06/14/2020   ALKPHOS 87 06/14/2020   AST 17 06/14/2020   ALT 14 06/14/2020   PROT 7.3 06/14/2020   ALBUMIN 4.2 06/14/2020   CALCIUM 9.9 09/17/2020   ANIONGAP 13 10/08/2019   GFR 81.09 08/02/2020   Lab Results  Component Value Date   CHOL 155 04/26/2020   Lab Results  Component Value Date   HDL 44.10 04/26/2020   Lab Results  Component Value Date   LDLCALC 91 04/26/2020   Lab Results  Component Value Date   TRIG 98.0 04/26/2020   Lab Results  Component Value Date   CHOLHDL 4 04/26/2020   Lab Results  Component Value Date   HGBA1C 8.1 (H) 08/02/2020      Assessment & Plan:   Problem List Items Addressed This Visit       Cardiovascular and Mediastinum   Essential hypertension   Relevant  Medications   amLODipine (NORVASC) 5 MG tablet   Other Relevant Orders   Basic metabolic panel (Completed)     Endocrine   Type 2 diabetes mellitus with hyperglycemia, without long-term current use of insulin (HCC) - Primary   Relevant Orders   Basic metabolic panel (Completed)     Other   Tobacco use   Diuretic-induced hypokalemia   Relevant Orders   Potassium    Meds ordered this encounter  Medications   amLODipine (NORVASC) 5 MG tablet    Sig: Take 1 tablet (5 mg total) by mouth daily.    Dispense:  90 tablet    Refill:  0    Have discontinued Chlorthalidone.     Follow-up: Return in about 3 months (around 12/18/2020), or Continue Rybelsus, watch carbohydrate and work on losing weight. Good luck!.  We had a long conversation about carbohydrate counting and consuming no more than 60 g per meal.  She was given information on carbohydrate counting.  Advised that her fasting sugars would also be depended on what she consumed the night before.  Hopeful that the 7 mg dose of Rybelsus will help a great deal.  Advised that weight loss of 10 to 15% or 25 pounds could make all the difference in the world to help her.  She is determined to do this.  Blood pressure is well controlled but will have to monitor potassium.  She admits that she will need help with tobacco cessation.  She had tried Chantix in the past and it gave her bad dreams.  We discussed using patches and gum.  She was given information on coping with quitting smoking.  Libby Maw, MD  8/1 addendum: persistent hypokalemia with chlorthalidone and supplemental potassium. Will discontinue chlorthalidone. Continue potassium for 2 more weeks and rtc for repeat potassium. Will start amlodipine. Check and record bps.

## 2020-09-18 LAB — BASIC METABOLIC PANEL
BUN: 18 mg/dL (ref 7–25)
CO2: 24 mmol/L (ref 20–32)
Calcium: 9.9 mg/dL (ref 8.6–10.4)
Chloride: 108 mmol/L (ref 98–110)
Creat: 0.72 mg/dL (ref 0.50–1.03)
Glucose, Bld: 112 mg/dL — ABNORMAL HIGH (ref 65–99)
Potassium: 3.2 mmol/L — ABNORMAL LOW (ref 3.5–5.3)
Sodium: 139 mmol/L (ref 135–146)

## 2020-09-20 MED ORDER — AMLODIPINE BESYLATE 5 MG PO TABS
5.0000 mg | ORAL_TABLET | Freq: Every day | ORAL | 0 refills | Status: DC
Start: 2020-09-20 — End: 2020-10-07

## 2020-09-20 NOTE — Progress Notes (Addendum)
Potassium is low. Have you been taking your potassium tablet? If not why?

## 2020-09-20 NOTE — Addendum Note (Signed)
Addended by: Jon Billings on: 09/20/2020 05:08 PM   Modules accepted: Orders

## 2020-09-21 ENCOUNTER — Other Ambulatory Visit: Payer: Self-pay | Admitting: Family Medicine

## 2020-09-21 ENCOUNTER — Telehealth: Payer: Self-pay | Admitting: Family Medicine

## 2020-09-21 DIAGNOSIS — N3289 Other specified disorders of bladder: Secondary | ICD-10-CM

## 2020-09-21 NOTE — Telephone Encounter (Signed)
Spoke with patient who had questions about new Rx that was sent in. Went over recommendations for new Rx patient verbally understood due to persistent hypokalemia with chlorthalidone and supplemental potassium. Dr. Ethelene Hal will discontinue chlorthalidone. Patient to continue potassium for 2 more weeks and rtc for repeat potassium. Will start amlodipine tomorrow due to taking Chlorthalidone this morning. Agrees to check and record bps.

## 2020-09-21 NOTE — Telephone Encounter (Signed)
Pt calling about a medication that was sent into her pharmacy, she has questions about it. Please call back at [phone]

## 2020-09-28 ENCOUNTER — Ambulatory Visit: Payer: 59 | Admitting: Orthopaedic Surgery

## 2020-09-29 ENCOUNTER — Telehealth: Payer: Self-pay | Admitting: Family Medicine

## 2020-09-29 NOTE — Telephone Encounter (Signed)
FYI message below. Please advise.

## 2020-09-29 NOTE — Telephone Encounter (Signed)
Pt called and said that her BP is 157/104 and she was having a really bad headache and she felt like she was going to vomit, I transferred pt to the triage nurse for them to talk to her and let them know we didn't have any appts left for today. The triage nurse called back and said that they told the patient that she needed to go to the ER and said that the patient refused and they were calling us back to make [place] aware. Please advise

## 2020-10-07 ENCOUNTER — Other Ambulatory Visit: Payer: Self-pay | Admitting: Neurology

## 2020-10-07 ENCOUNTER — Other Ambulatory Visit: Payer: Self-pay | Admitting: Family Medicine

## 2020-10-07 DIAGNOSIS — I10 Essential (primary) hypertension: Secondary | ICD-10-CM

## 2020-10-07 DIAGNOSIS — G43109 Migraine with aura, not intractable, without status migrainosus: Secondary | ICD-10-CM

## 2020-10-07 DIAGNOSIS — E1165 Type 2 diabetes mellitus with hyperglycemia: Secondary | ICD-10-CM

## 2020-10-07 DIAGNOSIS — E876 Hypokalemia: Secondary | ICD-10-CM

## 2020-10-07 DIAGNOSIS — T502X5A Adverse effect of carbonic-anhydrase inhibitors, benzothiadiazides and other diuretics, initial encounter: Secondary | ICD-10-CM

## 2020-10-15 ENCOUNTER — Telehealth: Payer: Self-pay | Admitting: Family Medicine

## 2020-10-15 NOTE — Telephone Encounter (Signed)
I called pt to reschedule appt and she said she no longer sees Dr Ethelene Hal and to remove him as PCP

## 2020-10-19 ENCOUNTER — Encounter: Payer: Self-pay | Admitting: Family Medicine

## 2020-10-19 NOTE — Telephone Encounter (Signed)
Dr. Ethelene Hal has been removed and letter generated and will mail to pt

## 2020-10-30 ENCOUNTER — Other Ambulatory Visit: Payer: Self-pay | Admitting: Neurology

## 2020-10-30 ENCOUNTER — Other Ambulatory Visit: Payer: Self-pay | Admitting: Family Medicine

## 2020-10-30 DIAGNOSIS — N3289 Other specified disorders of bladder: Secondary | ICD-10-CM

## 2020-11-04 ENCOUNTER — Ambulatory Visit: Payer: 59 | Admitting: Family Medicine

## 2020-11-18 ENCOUNTER — Other Ambulatory Visit: Payer: Self-pay | Admitting: Family Medicine

## 2020-11-18 DIAGNOSIS — E1165 Type 2 diabetes mellitus with hyperglycemia: Secondary | ICD-10-CM

## 2020-11-21 NOTE — Progress Notes (Signed)
Virtual Visit via Video Note The purpose of this virtual visit is to provide medical care while limiting exposure to the novel coronavirus.    Consent was obtained for video visit:  Yes.   Answered questions that patient had about telehealth interaction:  Yes.   I discussed the limitations, risks, security and privacy concerns of performing an evaluation and management service by telemedicine. I also discussed with the patient that there may be a patient responsible charge related to this service. The patient expressed understanding and agreed to proceed.  Pt location: Home Physician Location: office Name of referring provider:  Libby Maw,* I connected with Bernadene Person at patients initiation/request on 11/22/2020 at  3:30 PM EDT by video enabled telemedicine application and verified that I am speaking with the correct person using two identifiers. Pt MRN:  [id number] Pt DOB:  30-Jul-1970 Video Participants:  Bernadene Person  Assessment and Plan:   Migraine with aura, without status migrainosus, not intractable  Migraine prevention:  Continue Emgality every 28 days Migraine rescue:  Will have her try Nurtec instead of rizatriptan Limit use of pain relievers to no more than 2 days out of week to prevent risk of rebound or medication-overuse headache. Keep headache diary Follow up 6 months.  History of Present Illness:  [name] is a 50 year old female with HTN, aortic atherosclerosis, and high cholesterol who follows up for migraines.  UPDATE: Started Emgality 6 months ago. Intensity:  severe for first 2 days, then moderate-mild for 3rd day.   Duration:  3 days Frequency:  usually 2 migraines a month Still with a dull slight headache which she attributes to changes in her blood pressure medication.  Current NSAIDS/analgesics:  meloxicam 7.5mg  daily Current triptans:  Rizatriptan 10mg  Current ergotamine:  none Current anti-emetic:  none Current muscle relaxants:   none Current Antihypertensive medications:  Lisinopril, chlorthalidone Current Antidepressant medications:  none Current Anticonvulsant medications:  topiramate 50mg  BID Current anti-CGRP:  Emgality Current Vitamins/Herbal/Supplements:  none Current Antihistamines/Decongestants:  none Other therapy:  none Hormone/birth control:  none  HISTORY:  She started having migraines in her 7s.  They are severe, pounding, holocephalic pain.  Sometimes blackout of vision and scotoma prior to headache onset for 10-15 minutes.  Associated with nausea photophobia, phonophobia, blurred vision, dizziness.  She denies associated unilateral numbness or weakness.  Usually would last 1 to 2 days.  She had been on topiramate for several years.  While she has not had a migraine in 4 years, she does have a dull daily holocephalic headache for which she would treat with Goody powder.  She saw her PCP last month who took her off of topiramate.  Migraine headaches returned.  She was given rizatriptan which helps but she does not have enough.  They have lasted a week.    Past NSAIDS/analgesics:  diclofenac EC tab, ibuprofen, naproxen, Goody/BC powder Past abortive triptans:  Sumatriptan (upset stomach) Past abortive ergotamine:  none Past muscle relaxants:  Baclofen, tizanidine Past anti-emetic:  Zofran, Phenergan Past antihypertensive medications:  HCTZ Past antidepressant medications:  amitriptyline Past anticonvulsant medications:  Zonisamide Past anti-CGRP:  none Past vitamins/Herbal/Supplements:  none Past antihistamines/decongestants:  none Other past therapies:  none   Past Medical History: Past Medical History:  Diagnosis Date   Back pain    GERD (gastroesophageal reflux disease)    High cholesterol    Hypertension    Migraine    Scoliosis     Medications: Outpatient Encounter  Medications as of 11/22/2020  Medication Sig   acetaminophen-codeine (TYLENOL #3) 300-30 MG tablet Take 1-2 tablets by  mouth every 4 (four) hours as needed for moderate pain.   amLODipine (NORVASC) 5 MG tablet TAKE 1 TABLET (5 MG TOTAL) BY MOUTH DAILY.   Galcanezumab-gnlm (EMGALITY) 120 MG/ML SOAJ Inject 120 mg into the skin every 28 (twenty-eight) days.   lisinopril (ZESTRIL) 40 MG tablet TAKE 1 TABLET BY MOUTH EVERY DAY   meloxicam (MOBIC) 7.5 MG tablet Take 1 tablet (7.5 mg total) by mouth daily. With food   oxybutynin (DITROPAN-XL) 5 MG 24 hr tablet TAKE 1 TABLET BY MOUTH EVERYDAY AT BEDTIME   potassium chloride SA (KLOR-CON M20) 20 MEQ tablet TAKE ONE TWICE DAILY FOR 1 WEEK AND THEN ONE DAILY.   rizatriptan (MAXALT-MLT) 10 MG disintegrating tablet TAKE 1 TABLET EARLIEST ONSET OF MIGRAINE. MAY REPEAT IN 2 HOURS IF NEEDED. MAXIMUM 2 TABS 24 HOURS   rosuvastatin (CRESTOR) 10 MG tablet TAKE 1 TABLET BY MOUTH EVERY DAY   Semaglutide (RYBELSUS) 3 MG TABS Take 3 mg by mouth daily.   Semaglutide (RYBELSUS) 7 MG TABS Take 7 mg by mouth daily.   topiramate (TOPAMAX) 50 MG tablet TAKE 1 TABLET BY MOUTH TWICE A DAY   traMADol (ULTRAM) 50 MG tablet Take 1 tablet (50 mg total) by mouth every 6 (six) hours as needed.   [DISCONTINUED] lisinopril-hydrochlorothiazide (PRINZIDE,ZESTORETIC) 20-12.5 MG per tablet Take 2 tablets by mouth daily.   No facility-administered encounter medications on file as of 11/22/2020.    Allergies: Allergies  Allergen Reactions   Metformin And Related Diarrhea and Nausea And Vomiting    Family History: Family History  Problem Relation Age of Onset   Hypertension Mother    Stroke Mother    Cancer Mother    Diabetes Mother    Heart attack Father     Observations/Objective:   There were no vitals taken for this visit. No acute distress.  Alert and oriented.  Speech fluent and not dysarthric.    Follow Up Instructions:    -I discussed the assessment and treatment plan with the patient. The patient was provided an opportunity to ask questions and all were answered. The patient  agreed with the plan and demonstrated an understanding of the instructions.   The patient was advised to call back or seek an in-person evaluation if the symptoms worsen or if the condition fails to improve as anticipated.     Dudley Major, DO

## 2020-11-22 ENCOUNTER — Other Ambulatory Visit: Payer: Self-pay

## 2020-11-22 ENCOUNTER — Encounter: Payer: Self-pay | Admitting: Neurology

## 2020-11-22 ENCOUNTER — Telehealth (INDEPENDENT_AMBULATORY_CARE_PROVIDER_SITE_OTHER): Payer: 59 | Admitting: Neurology

## 2020-11-22 DIAGNOSIS — G43119 Migraine with aura, intractable, without status migrainosus: Secondary | ICD-10-CM | POA: Diagnosis not present

## 2020-11-22 NOTE — Patient Instructions (Signed)
Continue Emgality every 28 days Stop rizatriptan.  When you get a migraine, take Nurtec (no more than 1 tablet in 24 hours).   Follow up 6 months.

## 2020-12-01 ENCOUNTER — Other Ambulatory Visit: Payer: Self-pay | Admitting: Family Medicine

## 2020-12-01 DIAGNOSIS — N3289 Other specified disorders of bladder: Secondary | ICD-10-CM

## 2020-12-09 ENCOUNTER — Other Ambulatory Visit: Payer: Self-pay | Admitting: Neurology

## 2020-12-20 ENCOUNTER — Ambulatory Visit: Payer: 59 | Admitting: Family Medicine

## 2021-01-12 ENCOUNTER — Other Ambulatory Visit: Payer: Self-pay | Admitting: Family Medicine

## 2021-01-12 DIAGNOSIS — R7303 Prediabetes: Secondary | ICD-10-CM

## 2021-01-12 DIAGNOSIS — I10 Essential (primary) hypertension: Secondary | ICD-10-CM

## 2021-01-25 ENCOUNTER — Other Ambulatory Visit: Payer: Self-pay | Admitting: *Deleted

## 2021-01-25 DIAGNOSIS — R1032 Left lower quadrant pain: Secondary | ICD-10-CM

## 2021-01-26 ENCOUNTER — Ambulatory Visit
Admission: RE | Admit: 2021-01-26 | Discharge: 2021-01-26 | Disposition: A | Discharge status: Home / Self Care | Payer: 59 | Source: Ambulatory Visit | Attending: *Deleted | Admitting: *Deleted

## 2021-01-26 DIAGNOSIS — R1032 Left lower quadrant pain: Secondary | ICD-10-CM

## 2021-01-26 MED ORDER — IOPAMIDOL (ISOVUE-300) INJECTION 61%
100.0000 mL | Freq: Once | INTRAVENOUS | Status: AC | PRN
  Administered 2021-01-26: 100 mL via INTRAVENOUS

## 2021-02-05 ENCOUNTER — Other Ambulatory Visit: Payer: Self-pay | Admitting: Family Medicine

## 2021-02-05 DIAGNOSIS — N3289 Other specified disorders of bladder: Secondary | ICD-10-CM

## 2021-02-13 ENCOUNTER — Other Ambulatory Visit: Payer: Self-pay | Admitting: Family Medicine

## 2021-02-13 DIAGNOSIS — I10 Essential (primary) hypertension: Secondary | ICD-10-CM

## 2021-02-13 DIAGNOSIS — N3289 Other specified disorders of bladder: Secondary | ICD-10-CM

## 2021-04-07 ENCOUNTER — Other Ambulatory Visit: Payer: Self-pay | Admitting: Family Medicine

## 2021-04-07 DIAGNOSIS — N3289 Other specified disorders of bladder: Secondary | ICD-10-CM

## 2021-04-10 ENCOUNTER — Other Ambulatory Visit: Payer: Self-pay | Admitting: Family Medicine

## 2021-04-10 DIAGNOSIS — E7800 Pure hypercholesterolemia, unspecified: Secondary | ICD-10-CM

## 2021-04-12 ENCOUNTER — Other Ambulatory Visit: Payer: Self-pay | Admitting: Neurology

## 2021-04-14 ENCOUNTER — Other Ambulatory Visit: Payer: Self-pay | Admitting: Obstetrics and Gynecology

## 2021-04-14 DIAGNOSIS — Z1231 Encounter for screening mammogram for malignant neoplasm of breast: Secondary | ICD-10-CM

## 2021-04-15 ENCOUNTER — Other Ambulatory Visit: Payer: Self-pay | Admitting: Family Medicine

## 2021-04-15 DIAGNOSIS — N3289 Other specified disorders of bladder: Secondary | ICD-10-CM

## 2021-04-21 ENCOUNTER — Ambulatory Visit: Payer: BC Managed Care – PPO

## 2021-04-22 ENCOUNTER — Ambulatory Visit
Admission: RE | Admit: 2021-04-22 | Discharge: 2021-04-22 | Disposition: A | Discharge status: Home / Self Care | Payer: BC Managed Care – PPO | Source: Ambulatory Visit | Attending: Obstetrics and Gynecology | Admitting: Obstetrics and Gynecology

## 2021-04-22 DIAGNOSIS — Z1231 Encounter for screening mammogram for malignant neoplasm of breast: Secondary | ICD-10-CM

## 2021-04-25 ENCOUNTER — Other Ambulatory Visit: Payer: Self-pay | Admitting: Obstetrics and Gynecology

## 2021-04-25 DIAGNOSIS — R928 Other abnormal and inconclusive findings on diagnostic imaging of breast: Secondary | ICD-10-CM

## 2021-05-13 ENCOUNTER — Other Ambulatory Visit: Payer: Self-pay | Admitting: Obstetrics and Gynecology

## 2021-05-13 ENCOUNTER — Ambulatory Visit
Admission: RE | Admit: 2021-05-13 | Discharge: 2021-05-13 | Disposition: A | Discharge status: Home / Self Care | Payer: BC Managed Care – PPO | Source: Ambulatory Visit | Attending: Obstetrics and Gynecology | Admitting: Obstetrics and Gynecology

## 2021-05-13 DIAGNOSIS — N6312 Unspecified lump in the right breast, upper inner quadrant: Secondary | ICD-10-CM

## 2021-05-13 DIAGNOSIS — R928 Other abnormal and inconclusive findings on diagnostic imaging of breast: Secondary | ICD-10-CM

## 2021-05-19 ENCOUNTER — Other Ambulatory Visit: Payer: Self-pay | Admitting: Obstetrics and Gynecology

## 2021-05-19 DIAGNOSIS — N6312 Unspecified lump in the right breast, upper inner quadrant: Secondary | ICD-10-CM

## 2021-05-24 ENCOUNTER — Ambulatory Visit
Admission: RE | Admit: 2021-05-24 | Discharge: 2021-05-24 | Disposition: A | Discharge status: Home / Self Care | Payer: BC Managed Care – PPO | Source: Ambulatory Visit | Attending: Obstetrics and Gynecology | Admitting: Obstetrics and Gynecology

## 2021-05-24 DIAGNOSIS — N6312 Unspecified lump in the right breast, upper inner quadrant: Secondary | ICD-10-CM

## 2021-06-21 ENCOUNTER — Ambulatory Visit: Payer: BC Managed Care – PPO | Admitting: Orthopaedic Surgery

## 2021-07-02 ENCOUNTER — Other Ambulatory Visit: Payer: Self-pay | Admitting: Family Medicine

## 2021-07-02 DIAGNOSIS — E7800 Pure hypercholesterolemia, unspecified: Secondary | ICD-10-CM

## 2021-07-02 DIAGNOSIS — N3289 Other specified disorders of bladder: Secondary | ICD-10-CM

## 2021-07-02 DIAGNOSIS — I10 Essential (primary) hypertension: Secondary | ICD-10-CM

## 2021-07-20 ENCOUNTER — Other Ambulatory Visit: Payer: Self-pay | Admitting: Family Medicine

## 2021-07-20 DIAGNOSIS — E7800 Pure hypercholesterolemia, unspecified: Secondary | ICD-10-CM

## 2021-07-20 DIAGNOSIS — I10 Essential (primary) hypertension: Secondary | ICD-10-CM

## 2021-07-23 ENCOUNTER — Other Ambulatory Visit: Payer: Self-pay | Admitting: Family Medicine

## 2021-07-23 DIAGNOSIS — E7800 Pure hypercholesterolemia, unspecified: Secondary | ICD-10-CM

## 2021-07-26 ENCOUNTER — Other Ambulatory Visit: Payer: Self-pay | Admitting: Family Medicine

## 2021-07-26 DIAGNOSIS — E7800 Pure hypercholesterolemia, unspecified: Secondary | ICD-10-CM

## 2021-09-18 ENCOUNTER — Other Ambulatory Visit: Payer: Self-pay | Admitting: Family Medicine

## 2021-09-18 DIAGNOSIS — I10 Essential (primary) hypertension: Secondary | ICD-10-CM

## 2021-10-11 ENCOUNTER — Other Ambulatory Visit (HOSPITAL_COMMUNITY): Payer: Self-pay

## 2021-10-11 MED ORDER — SEMAGLUTIDE-WEIGHT MANAGEMENT 0.25 MG/0.5ML ~~LOC~~ SOAJ
SUBCUTANEOUS | 0 refills | Status: DC
  Filled 2021-10-11: qty 2, 28d supply, fill #0

## 2021-11-01 ENCOUNTER — Other Ambulatory Visit (HOSPITAL_COMMUNITY): Payer: Self-pay

## 2021-11-02 ENCOUNTER — Other Ambulatory Visit (HOSPITAL_BASED_OUTPATIENT_CLINIC_OR_DEPARTMENT_OTHER): Payer: Self-pay

## 2021-11-02 ENCOUNTER — Other Ambulatory Visit (HOSPITAL_COMMUNITY): Payer: Self-pay

## 2021-11-02 MED ORDER — WEGOVY 0.25 MG/0.5ML ~~LOC~~ SOAJ
0.2500 mg | SUBCUTANEOUS | 1 refills | Status: DC
  Filled 2021-11-16 – 2021-11-30 (×2): qty 2, 28d supply, fill #0

## 2021-11-02 MED ORDER — SAXENDA 18 MG/3ML ~~LOC~~ SOPN
PEN_INJECTOR | SUBCUTANEOUS | 0 refills | Status: AC
  Filled 2021-11-02: qty 15, 28d supply, fill #0

## 2021-11-04 ENCOUNTER — Other Ambulatory Visit (HOSPITAL_COMMUNITY): Payer: Self-pay

## 2021-11-14 ENCOUNTER — Other Ambulatory Visit (HOSPITAL_COMMUNITY): Payer: Self-pay

## 2021-11-16 ENCOUNTER — Other Ambulatory Visit (HOSPITAL_COMMUNITY): Payer: Self-pay

## 2021-11-30 ENCOUNTER — Other Ambulatory Visit (HOSPITAL_COMMUNITY): Payer: Self-pay

## 2021-12-12 ENCOUNTER — Other Ambulatory Visit (HOSPITAL_BASED_OUTPATIENT_CLINIC_OR_DEPARTMENT_OTHER): Payer: Self-pay

## 2021-12-14 ENCOUNTER — Other Ambulatory Visit (HOSPITAL_BASED_OUTPATIENT_CLINIC_OR_DEPARTMENT_OTHER): Payer: Self-pay

## 2021-12-14 MED ORDER — SAXENDA 18 MG/3ML ~~LOC~~ SOPN
1.8000 mg | PEN_INJECTOR | Freq: Every day | SUBCUTANEOUS | 2 refills | Status: DC
  Filled 2021-12-14 – 2021-12-16 (×2): qty 3, 10d supply, fill #0

## 2021-12-15 ENCOUNTER — Other Ambulatory Visit (HOSPITAL_BASED_OUTPATIENT_CLINIC_OR_DEPARTMENT_OTHER): Payer: Self-pay

## 2021-12-16 ENCOUNTER — Other Ambulatory Visit: Payer: Self-pay | Admitting: Family Medicine

## 2021-12-16 ENCOUNTER — Other Ambulatory Visit (HOSPITAL_BASED_OUTPATIENT_CLINIC_OR_DEPARTMENT_OTHER): Payer: Self-pay

## 2021-12-16 ENCOUNTER — Other Ambulatory Visit (HOSPITAL_COMMUNITY): Payer: Self-pay

## 2021-12-16 DIAGNOSIS — I10 Essential (primary) hypertension: Secondary | ICD-10-CM

## 2021-12-17 ENCOUNTER — Other Ambulatory Visit: Payer: Self-pay | Admitting: Family Medicine

## 2021-12-17 DIAGNOSIS — I10 Essential (primary) hypertension: Secondary | ICD-10-CM

## 2021-12-17 DIAGNOSIS — E1165 Type 2 diabetes mellitus with hyperglycemia: Secondary | ICD-10-CM

## 2021-12-19 ENCOUNTER — Other Ambulatory Visit (HOSPITAL_COMMUNITY): Payer: Self-pay

## 2021-12-23 ENCOUNTER — Other Ambulatory Visit (HOSPITAL_BASED_OUTPATIENT_CLINIC_OR_DEPARTMENT_OTHER): Payer: Self-pay

## 2021-12-24 ENCOUNTER — Other Ambulatory Visit (HOSPITAL_COMMUNITY): Payer: Self-pay

## 2021-12-27 ENCOUNTER — Other Ambulatory Visit (HOSPITAL_COMMUNITY): Payer: Self-pay

## 2021-12-29 IMAGING — XA DG FLUORO GUIDE NDL PLC/BX
2 series · 2 of 2 positions shown · non-contrast
Comparison: none

CLINICAL DATA: Pain and limited range of motion.

[Series 1: ortho standard · 1 of 1 slices shown (1 of 2)]
[im 1/1]
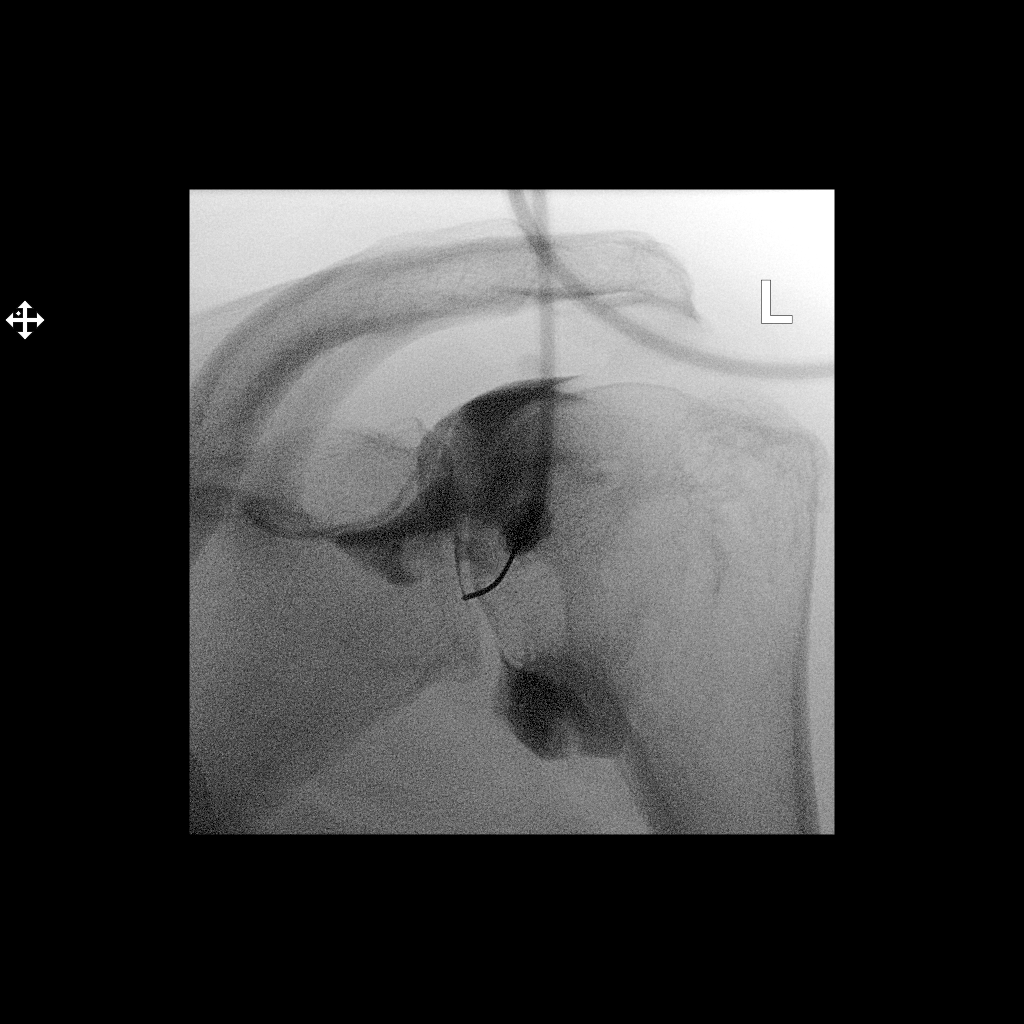

[Series 2: ortho standard · 1 of 1 slices shown (2 of 2)]
[im 1/1]
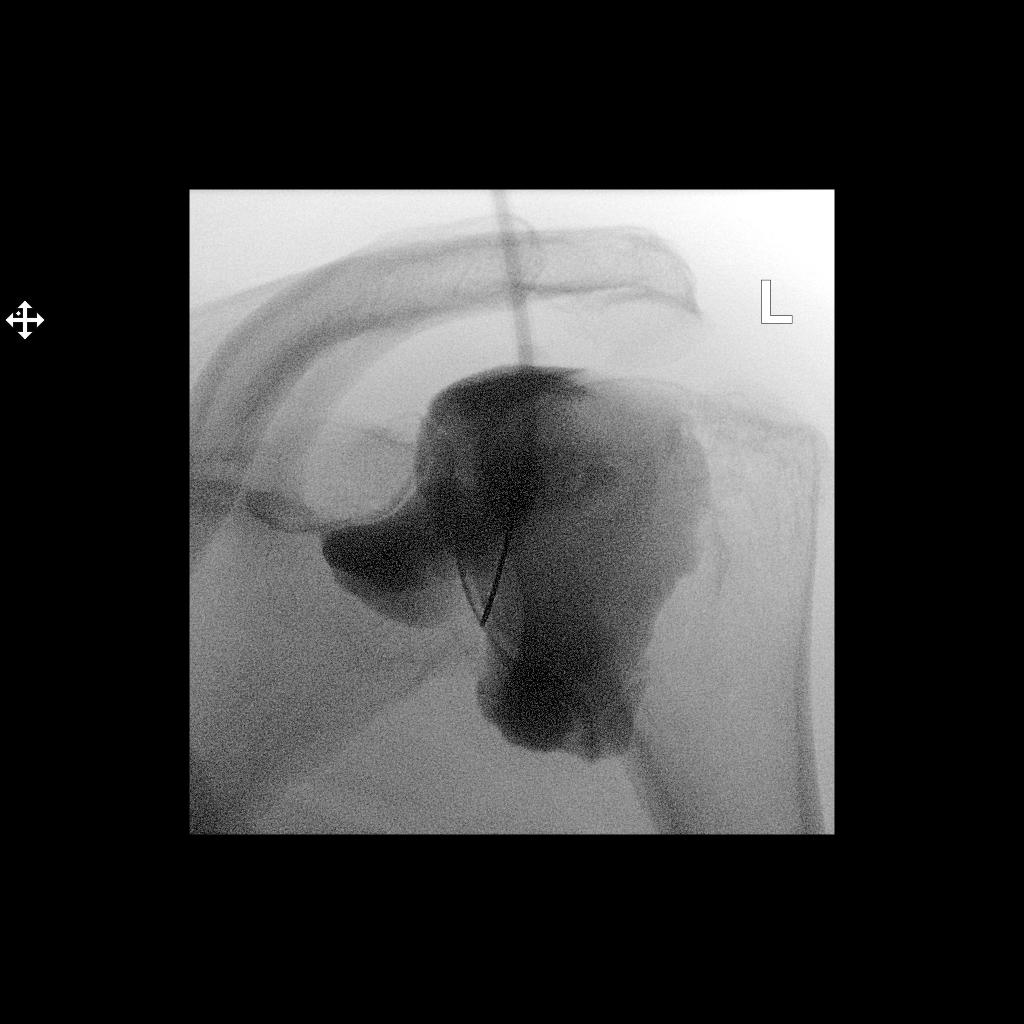

[2 of 2 positions shown; findings below may reference images not displayed]

FLUOROSCOPY TIME:  0 minutes 15 seconds. 4.82 micro gray meter
squared

PROCEDURE:
Left SHOULDER INJECTION UNDER FLUOROSCOPY

The skin overlying the shoulder was scrubbed with Betadine and
draped in sterile fashion. Skin and subcutaneous anesthesia was
carried out using a 25 gauge needle and 1% lidocaine. A 22 gauge
spinal needle was directed under fluoroscopic guidance on one pass
into the glenohumeral joint. 20 cc of a mixture of 0.1 cc MultiHance
and dilute Isovue 200 was then used to fill the glenohumeral joint.
IMPRESSION: Technically successful left shoulder injection for MRI.

## 2021-12-29 IMAGING — MR MR SHOULDER*L* W/ CM
4 of 6 series · 13 of 40 positions shown · IV contrast (agent unspecified)
Comparison: None.

CLINICAL DATA: Left shoulder pain since January 2020. No known
injury.

EXAM:
MR ARTHROGRAM OF THE LEFT SHOULDER
TECHNIQUE: Multiplanar, multisequence MR imaging of the left shoulder was
performed following the administration of intra-articular contrast.
CONTRAST:  See Injection Documentation.

[Series 7: T1 fat-sat · axial · left · 3.0mm · 0.36mm/px · z∈[-33,+32]mm · 3 of 29 slices shown (1 of 2)]
[im 5/29]
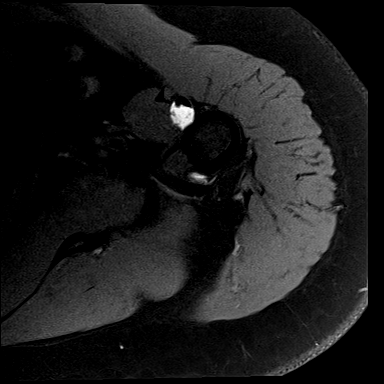
[im 15/29]
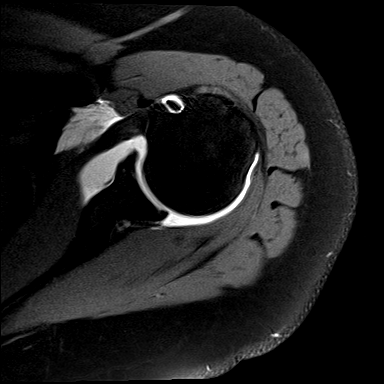
[im 24/29]
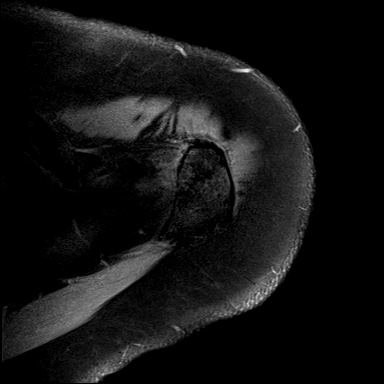

[Series 8: T2 fat-sat · oblique · left · 3.0mm · 0.22mm/px · 4 of 25 slices shown (1 of 2)]
[im 1/25]
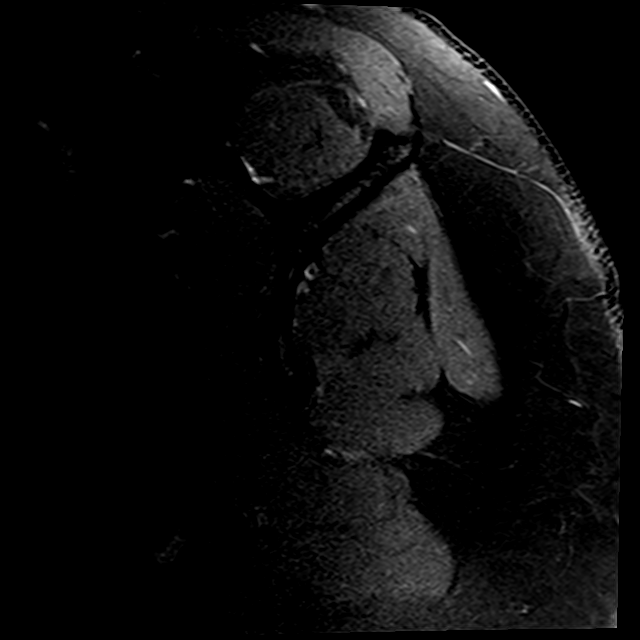
[im 5/25]
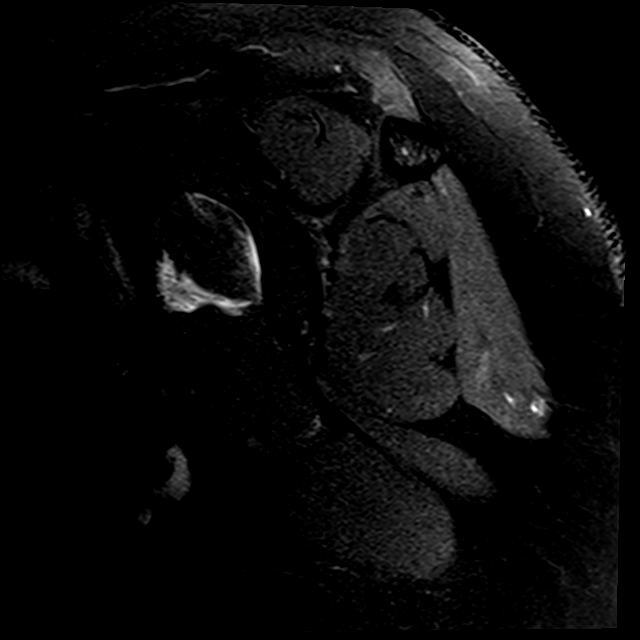
[im 15/25]
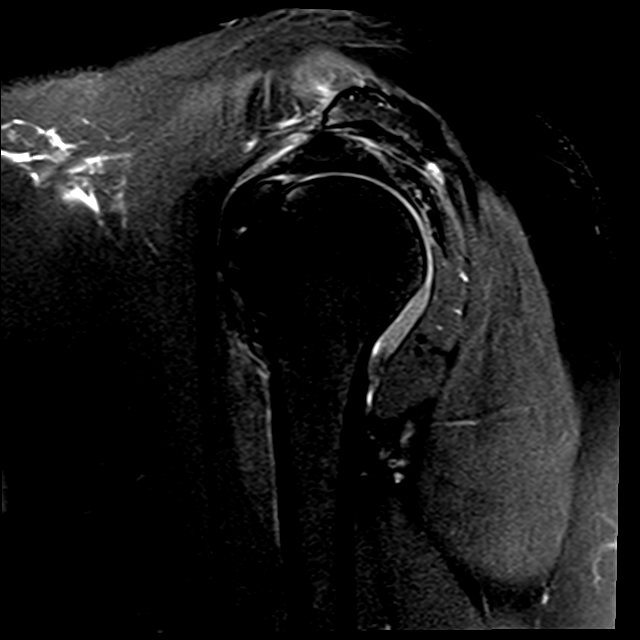
[im 25/25]
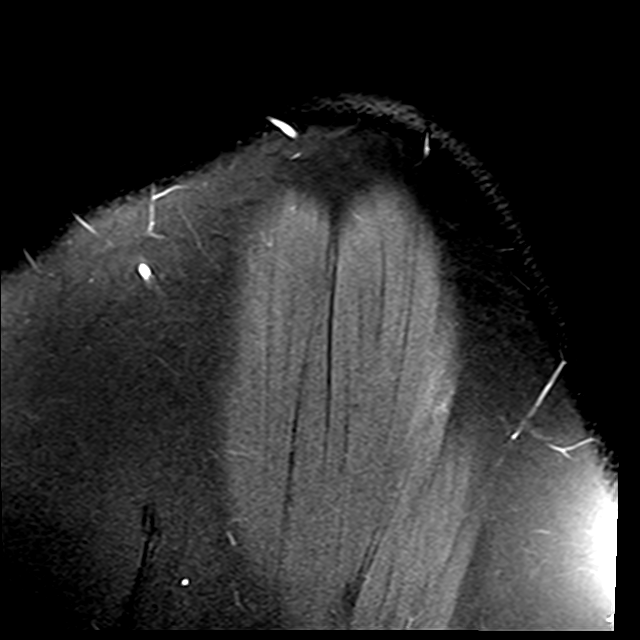

[Series 100: T1 fat-sat · oblique · left · 3.0mm · 0.18mm/px · 3 of 25 slices shown (2 of 2)]
[im 5/25]
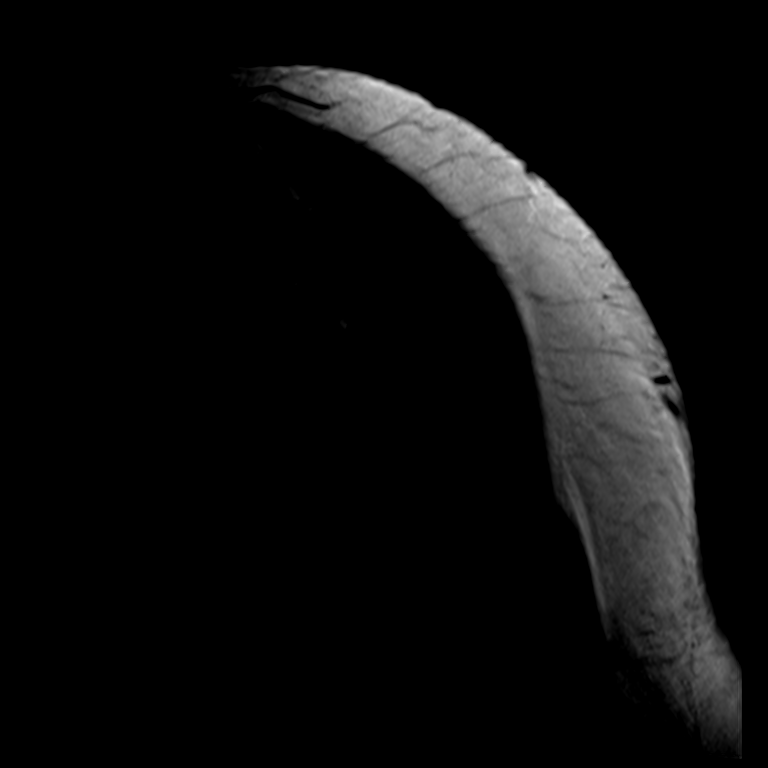
[im 13/25]
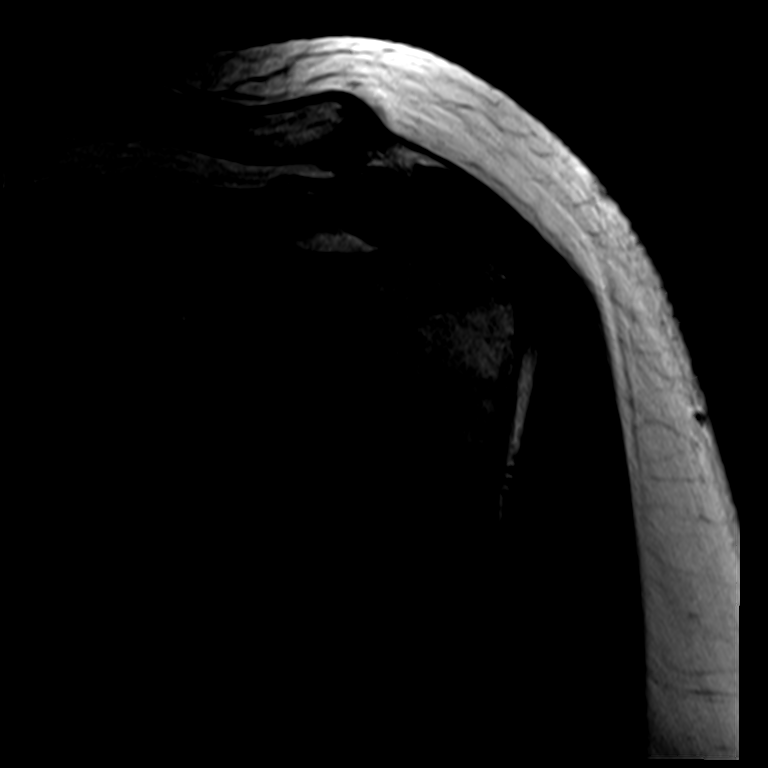
[im 21/25]
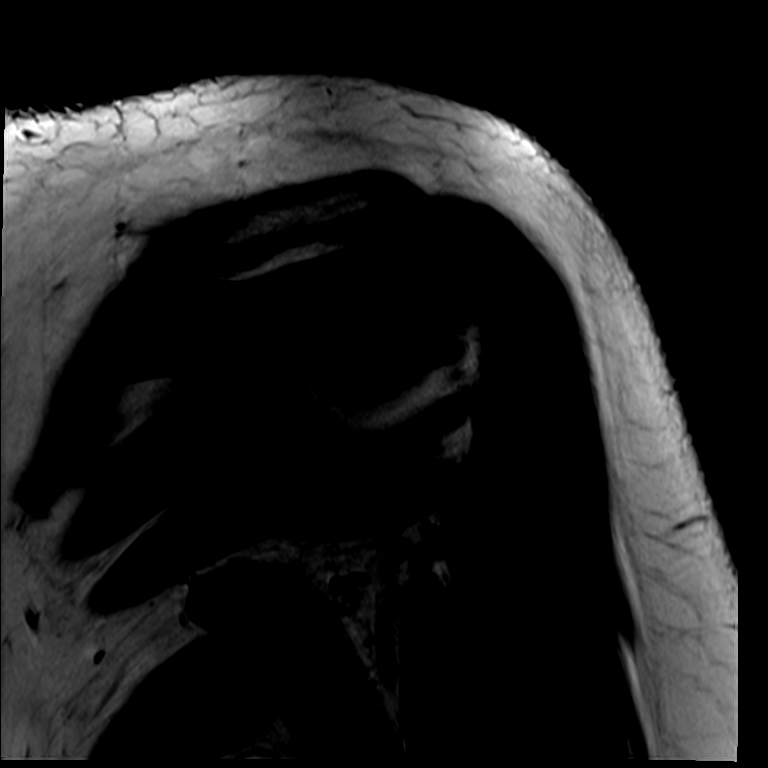

[Series 101: T2 fat-sat · oblique · left · 3.0mm · 0.22mm/px · 3 of 25 slices shown (2 of 2)]
[im 5/25]
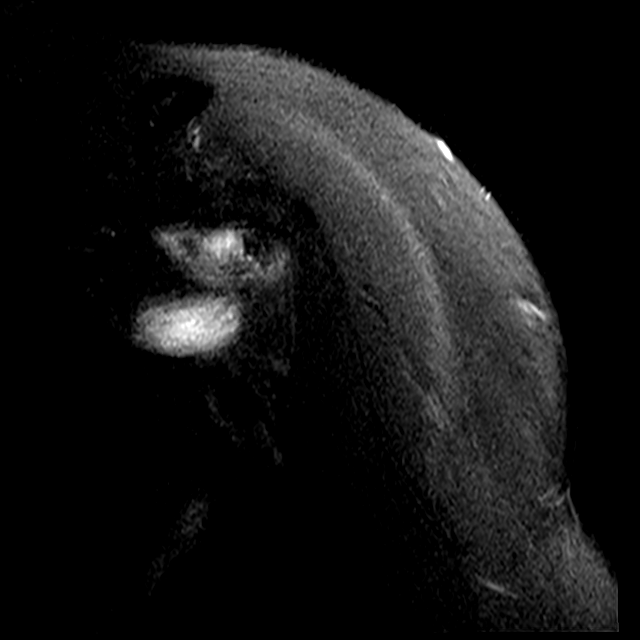
[im 13/25]
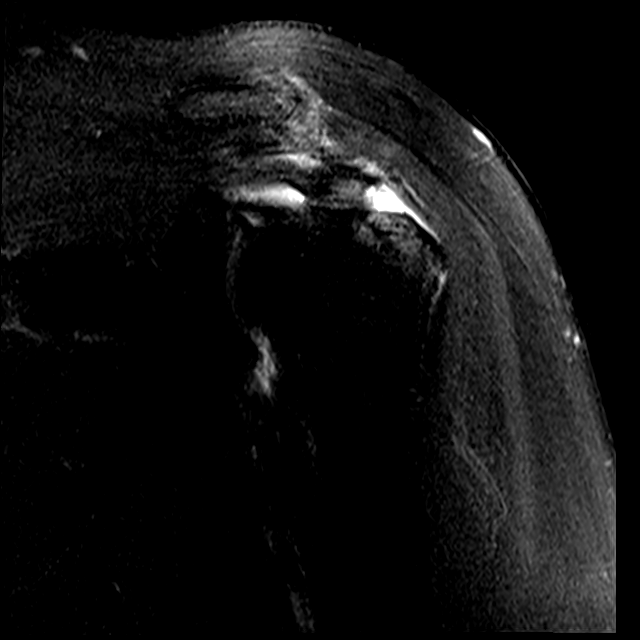
[im 21/25]
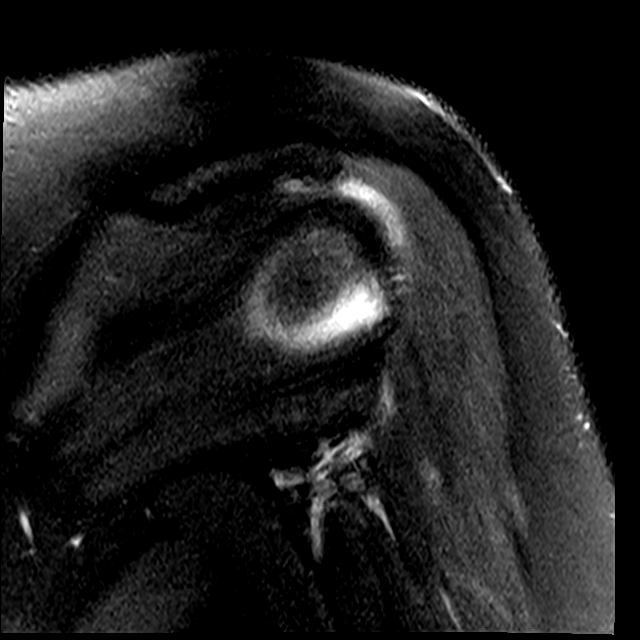

[13 of 40 positions shown; findings below may reference images not displayed]

FINDINGS: Rotator cuff: Moderate tendinosis of the supraspinatus tendon with a
high-grade partial-thickness, near complete, articular surface tear.
Severe tendinosis of the infraspinatus tendon. Teres minor tendon is
intact. Subscapularis tendon is intact.

Muscles: No muscle atrophy or edema. No intramuscular fluid
collection or hematoma.

Biceps Long Head: Intraarticular and extraarticular portions of the
biceps tendon are intact.

Acromioclavicular Joint: Mild arthropathy of the acromioclavicular
joint. Type I acromion. Trace subacromial/subdeltoid bursal fluid.

Glenohumeral Joint: Intraarticular contrast distending the joint
capsule. No chondral defect. Normal glenohumeral ligaments.

Labrum: Intact

Bones: No fracture or dislocation. No aggressive osseous lesion.

Other: No fluid collection or hematoma.
IMPRESSION: 1. Moderate tendinosis of the supraspinatus tendon with a high-grade
partial-thickness, near complete, articular surface tear.
2. Severe tendinosis of the infraspinatus tendon.

## 2022-01-03 ENCOUNTER — Other Ambulatory Visit (HOSPITAL_COMMUNITY): Payer: Self-pay

## 2022-01-03 MED ORDER — SAXENDA 18 MG/3ML ~~LOC~~ SOPN
3.0000 mg | PEN_INJECTOR | Freq: Every day | SUBCUTANEOUS | 1 refills | Status: DC
  Filled 2022-01-03: qty 15, 30d supply, fill #0
  Filled 2022-01-27: qty 15, 30d supply, fill #1
  Filled 2022-02-27: qty 15, 30d supply, fill #2

## 2022-01-10 ENCOUNTER — Other Ambulatory Visit (HOSPITAL_COMMUNITY): Payer: Self-pay

## 2022-01-13 ENCOUNTER — Other Ambulatory Visit: Payer: Self-pay | Admitting: Family Medicine

## 2022-01-13 DIAGNOSIS — E7800 Pure hypercholesterolemia, unspecified: Secondary | ICD-10-CM

## 2022-01-13 DIAGNOSIS — E1165 Type 2 diabetes mellitus with hyperglycemia: Secondary | ICD-10-CM

## 2022-01-13 DIAGNOSIS — I10 Essential (primary) hypertension: Secondary | ICD-10-CM

## 2022-01-27 ENCOUNTER — Other Ambulatory Visit (HOSPITAL_COMMUNITY): Payer: Self-pay

## 2022-01-31 ENCOUNTER — Other Ambulatory Visit (HOSPITAL_COMMUNITY): Payer: Self-pay

## 2022-02-17 ENCOUNTER — Other Ambulatory Visit (HOSPITAL_COMMUNITY): Payer: Self-pay

## 2022-02-27 ENCOUNTER — Other Ambulatory Visit (HOSPITAL_COMMUNITY): Payer: Self-pay

## 2022-03-05 ENCOUNTER — Other Ambulatory Visit (HOSPITAL_BASED_OUTPATIENT_CLINIC_OR_DEPARTMENT_OTHER): Payer: Self-pay

## 2022-03-05 ENCOUNTER — Other Ambulatory Visit: Payer: Self-pay | Admitting: Family Medicine

## 2022-03-05 DIAGNOSIS — E1165 Type 2 diabetes mellitus with hyperglycemia: Secondary | ICD-10-CM

## 2022-03-06 ENCOUNTER — Encounter (HOSPITAL_BASED_OUTPATIENT_CLINIC_OR_DEPARTMENT_OTHER): Payer: Self-pay | Admitting: Pharmacist

## 2022-03-06 ENCOUNTER — Other Ambulatory Visit (HOSPITAL_BASED_OUTPATIENT_CLINIC_OR_DEPARTMENT_OTHER): Payer: Self-pay

## 2022-03-06 MED ORDER — WEGOVY 0.5 MG/0.5ML ~~LOC~~ SOAJ
0.5000 mg | SUBCUTANEOUS | 1 refills | Status: DC
  Filled 2022-03-06: qty 2, 28d supply, fill #0
  Filled 2022-04-01: qty 2, 28d supply, fill #1

## 2022-03-06 MED ORDER — UNIFINE PENTIPS 31G X 8 MM MISC
1 refills | Status: AC
  Filled 2022-03-06: qty 100, 90d supply, fill #0

## 2022-03-07 ENCOUNTER — Other Ambulatory Visit (HOSPITAL_BASED_OUTPATIENT_CLINIC_OR_DEPARTMENT_OTHER): Payer: Self-pay

## 2022-03-08 ENCOUNTER — Other Ambulatory Visit (HOSPITAL_BASED_OUTPATIENT_CLINIC_OR_DEPARTMENT_OTHER): Payer: Self-pay

## 2022-03-08 ENCOUNTER — Other Ambulatory Visit (HOSPITAL_COMMUNITY): Payer: Self-pay

## 2022-04-01 ENCOUNTER — Other Ambulatory Visit (HOSPITAL_BASED_OUTPATIENT_CLINIC_OR_DEPARTMENT_OTHER): Payer: Self-pay

## 2022-04-01 ENCOUNTER — Other Ambulatory Visit: Payer: Self-pay | Admitting: Family Medicine

## 2022-04-01 DIAGNOSIS — E1165 Type 2 diabetes mellitus with hyperglycemia: Secondary | ICD-10-CM

## 2022-04-02 ENCOUNTER — Other Ambulatory Visit (HOSPITAL_BASED_OUTPATIENT_CLINIC_OR_DEPARTMENT_OTHER): Payer: Self-pay

## 2022-04-03 ENCOUNTER — Other Ambulatory Visit: Payer: Self-pay

## 2022-04-04 ENCOUNTER — Other Ambulatory Visit (HOSPITAL_BASED_OUTPATIENT_CLINIC_OR_DEPARTMENT_OTHER): Payer: Self-pay

## 2022-04-25 ENCOUNTER — Other Ambulatory Visit: Payer: Self-pay | Admitting: *Deleted

## 2022-04-25 DIAGNOSIS — Z1231 Encounter for screening mammogram for malignant neoplasm of breast: Secondary | ICD-10-CM

## 2022-06-06 ENCOUNTER — Ambulatory Visit
Admission: RE | Admit: 2022-06-06 | Discharge: 2022-06-06 | Disposition: A | Discharge status: Home / Self Care | Payer: BC Managed Care – PPO | Source: Ambulatory Visit | Attending: *Deleted | Admitting: *Deleted

## 2022-06-06 DIAGNOSIS — Z1231 Encounter for screening mammogram for malignant neoplasm of breast: Secondary | ICD-10-CM

## 2022-07-05 IMAGING — CT CT ABD-PELV W/ CM
2 of 5 series · 15 of 46 positions shown, 17 images · IV contrast (iopamidol)
Comparison: October 08, 2019.

CLINICAL DATA: Acute left lower quadrant abdominal pain.

EXAM:
CT ABDOMEN AND PELVIS WITH CONTRAST
TECHNIQUE: Multidetector CT imaging of the abdomen and pelvis was performed
using the standard protocol following bolus administration of
intravenous contrast.
CONTRAST:  100mL [id number] IOPAMIDOL ([id number]) INJECTION 61%

[Series 2: abd pelvis 5.00 br40 s3 axial · axial · 0.72mm/px · z∈[+1220,+1670]mm · 12 of 103 slices shown, 14 images]
[im 7/103  soft-tissue]
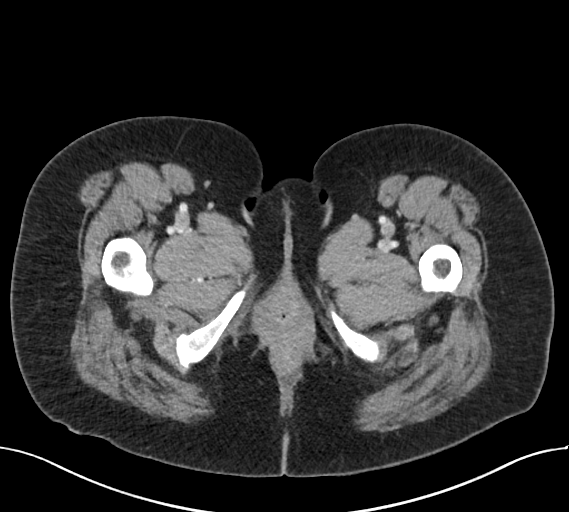
[im 7/103  bone]
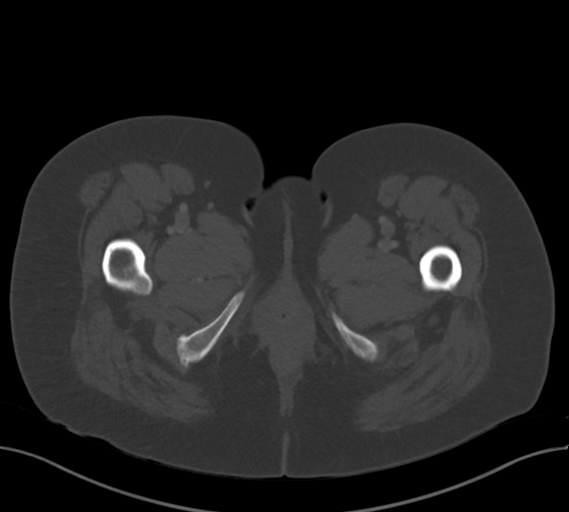
[im 19/103  soft-tissue]
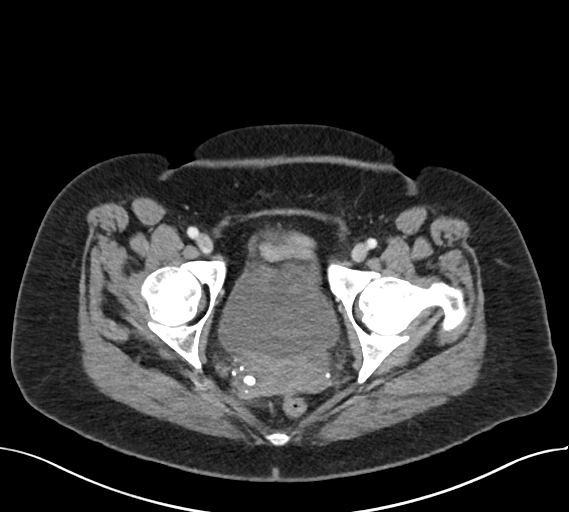
[im 25/103  soft-tissue]
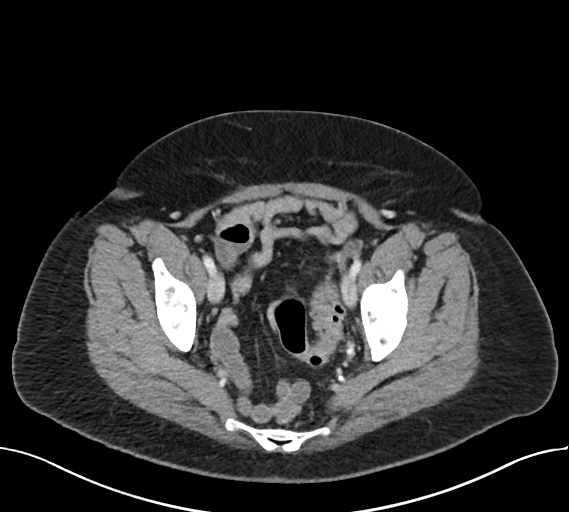
[im 31/103  soft-tissue]
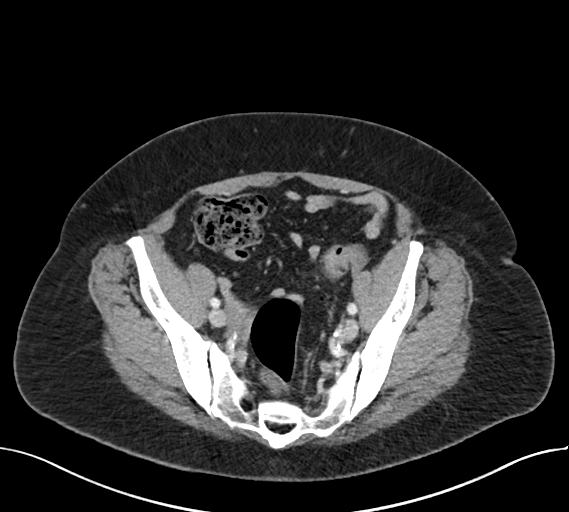
[im 43/103  soft-tissue]
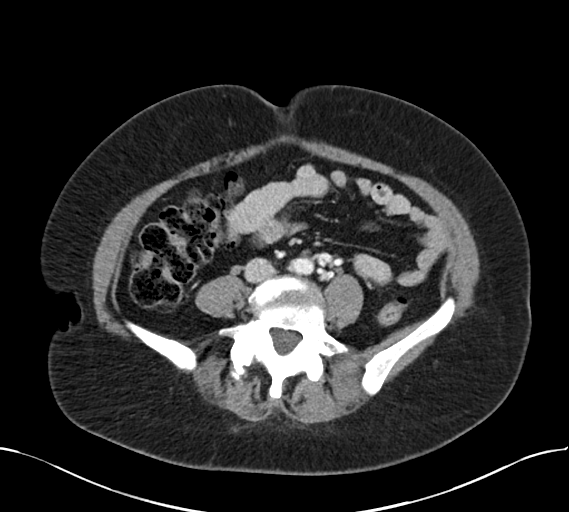
[im 49/103  soft-tissue]
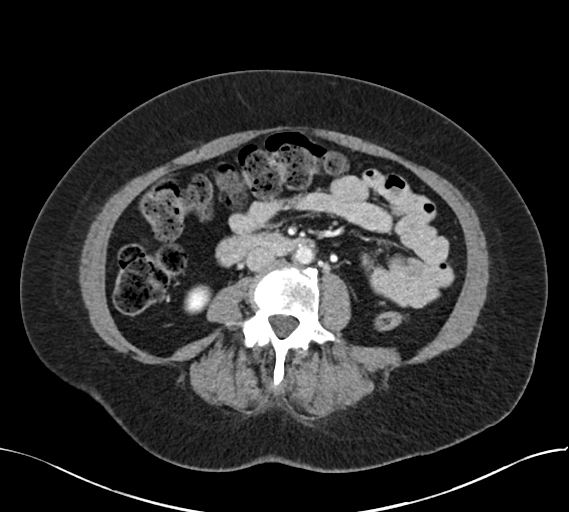
[im 55/103  soft-tissue]
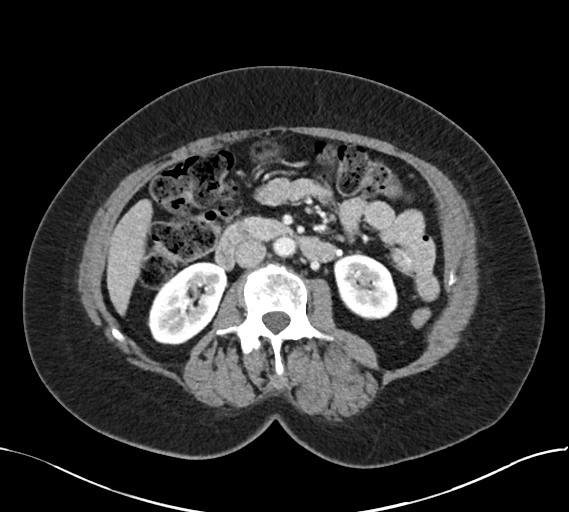
[im 67/103  soft-tissue]
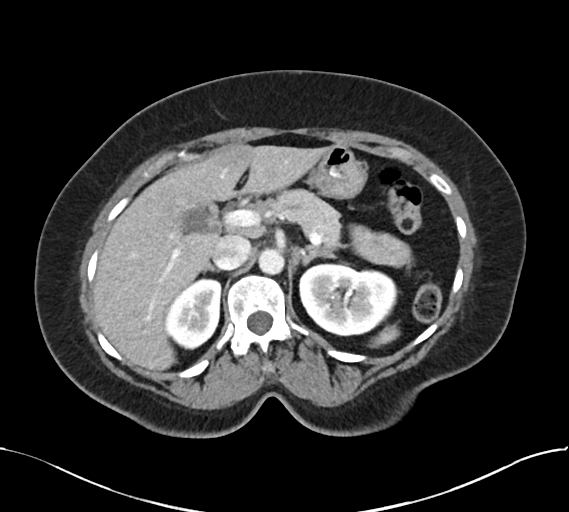
[im 73/103  soft-tissue]
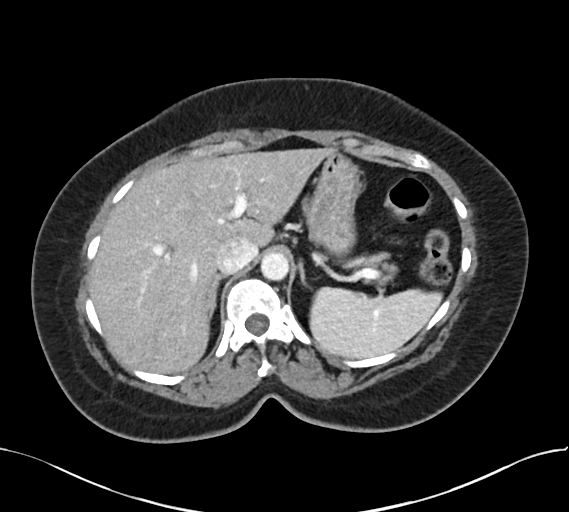
[im 73/103  bone]
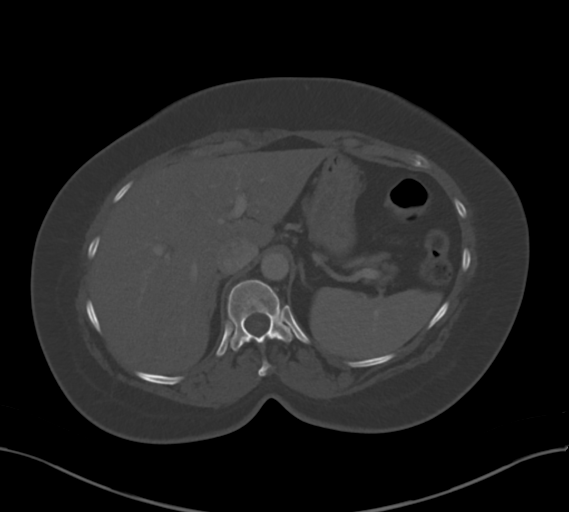
[im 79/103  soft-tissue]
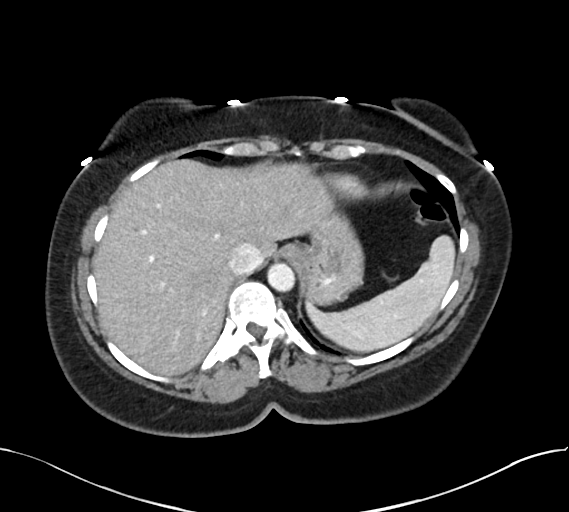
[im 91/103  soft-tissue]
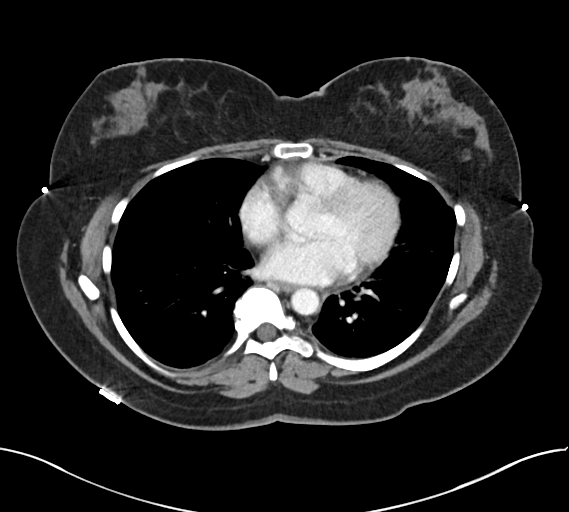
[im 97/103  soft-tissue]
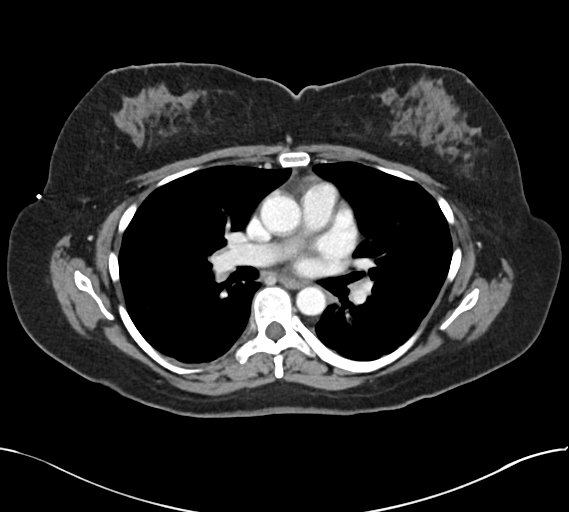

[Series 6: abd pelvis 2.00 br40 s3 cor · coronal · 0.81mm/px · 3 of 162 slices shown]
[im 54/162  soft-tissue]
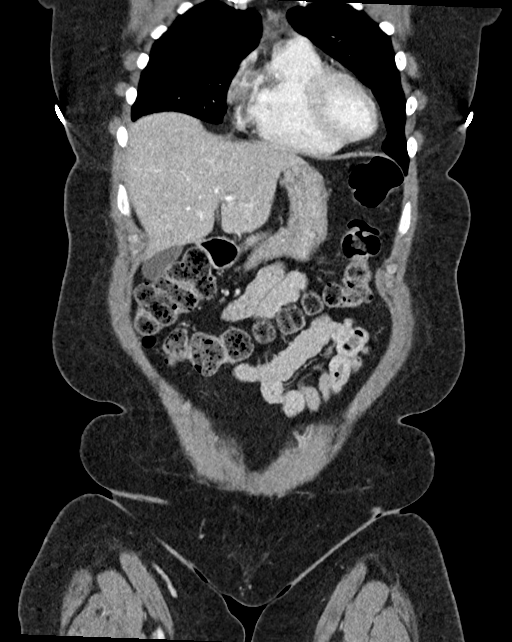
[im 72/162  soft-tissue]
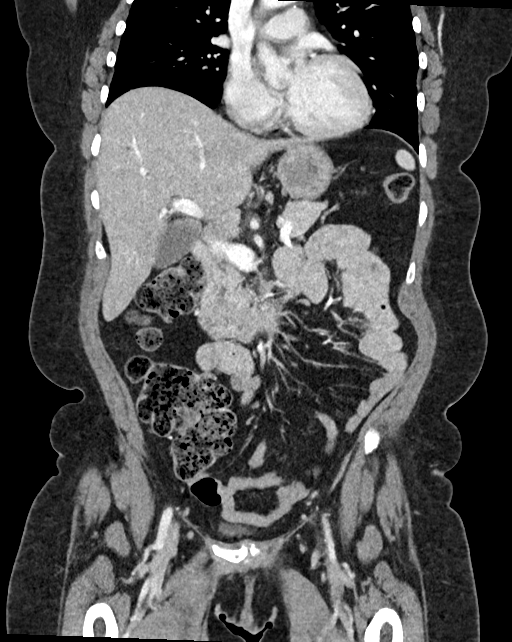
[im 90/162  soft-tissue]
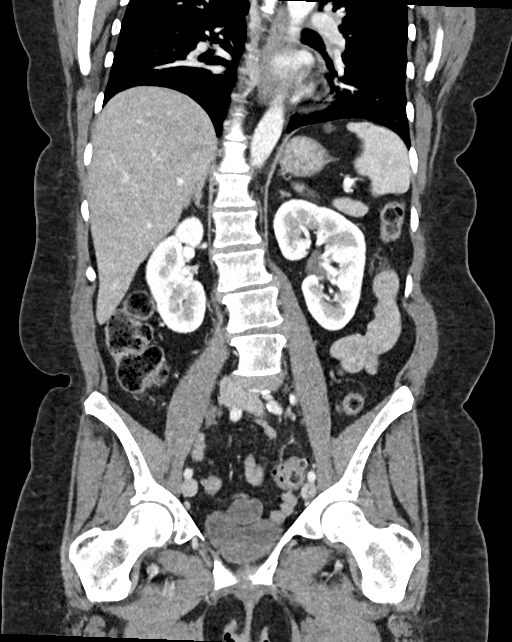

[15 of 46 positions shown; findings below may reference images not displayed]

FINDINGS: Lower chest: No acute abnormality.

Hepatobiliary: No focal liver abnormality is seen. No gallstones,
gallbladder wall thickening, or biliary dilatation.

Pancreas: Unremarkable. No pancreatic ductal dilatation or
surrounding inflammatory changes.

Spleen: Normal in size without focal abnormality.

Adrenals/Urinary Tract: Adrenal glands appear normal. Small
nonobstructive left renal calculus is noted. No hydronephrosis or
renal obstruction is noted. Urinary bladder is unremarkable.

Stomach/Bowel: Stomach and appendix are unremarkable. There is no
evidence of bowel obstruction. There appears to be minimal focal
diverticulitis involving the sigmoid colon best seen on image number
113 of series 8.

Vascular/Lymphatic: Aortic atherosclerosis. No enlarged abdominal or
pelvic lymph nodes.

Reproductive: Status post hysterectomy. No adnexal masses.

Other: No abdominal wall hernia or abnormality. No abdominopelvic
ascites.

Musculoskeletal: No acute or significant osseous findings.
IMPRESSION: Probable focal diverticulitis is seen involving the proximal sigmoid
colon. No definite abscess formation is noted. These results will be
called to the ordering clinician or representative by the
Radiologist Assistant, and communication documented in the PACS or
[facility] Dashboard.

Small nonobstructive left renal calculus.

Aortic Atherosclerosis (50RYK-HN2.2).

## 2023-02-23 ENCOUNTER — Encounter: Payer: BC Managed Care – PPO | Admitting: Family Medicine

## 2023-02-23 ENCOUNTER — Telehealth: Payer: Self-pay | Admitting: *Deleted

## 2023-02-23 NOTE — Telephone Encounter (Signed)
 01.03.2025 no show

## 2023-02-28 NOTE — Telephone Encounter (Signed)
 Appt should not have been scheduled with Dr. Veto Kemps - pt advised she changed PCP 2022

## 2023-04-30 ENCOUNTER — Ambulatory Visit: Admitting: Podiatry

## 2023-04-30 DIAGNOSIS — D2371 Other benign neoplasm of skin of right lower limb, including hip: Secondary | ICD-10-CM

## 2023-04-30 DIAGNOSIS — L84 Corns and callosities: Secondary | ICD-10-CM

## 2023-04-30 MED ORDER — MELOXICAM 15 MG PO TABS: 15.0000 mg | ORAL_TABLET | Freq: Every day | ORAL | 1 refills | Status: AC

## 2023-04-30 NOTE — Progress Notes (Unsigned)
     Chief Complaint  Patient presents with   Callouses    ' I thought it was a corn and doctor on it myself for 1 month for a corn and nothing over the counter and she went to see her PCP and they told her it was a callous and she came to have it taken off"    HPI: 53 y.o. female presents today with concern of a possible corn or callus on the plantar aspect of the right foot near the fifth toe.  States is extremely painful.  Denies throbbing.  Notes it feels like something sharp stabbing up from the bottom of her foot.  Denies stepping on a foreign object that she can recall.  Denies any drainage.  States that this has been present for about 1 month.  She has a trip coming up in May or June, and wants to make sure this is gone so that she will be comfortable when walking.  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  Past Medical History:  Diagnosis Date   Back pain    GERD (gastroesophageal reflux disease)    High cholesterol    Hypertension    Migraine    Scoliosis     Past Surgical History:  Procedure Laterality Date   ABDOMINAL HYSTERECTOMY     ABDOMINAL SURGERY     CESAREAN SECTION     HAND SURGERY      Allergies  Allergen Reactions   Metformin And Related Diarrhea and Nausea And Vomiting    Physical Exam: There are palpable pedal pulses noted.  There is a focal hyperkeratotic lesion with pain on palpation to the plantar lateral aspect of the right fifth metatarsal head.  No surrounding erythema is noted.  Minimal surrounding edema is appreciated.  No drainage is noted.  No evidence of foreign body is seen.  Assessment/Plan of Care: 1. Corns and callosities   2. Benign neoplasm of skin of right foot      Meds ordered this encounter  Medications   meloxicam (MOBIC) 15 MG tablet    Sig: Take 1 tablet (15 mg total) by mouth daily.    Dispense:  30 tablet    Refill:  1   Discussed clinical findings with patient today.   Prescription for meloxicam 15 mg 1 tab p.o. daily sent in for the inflammation/bursitis to the area around the benign skin lesion.  With the patient consent a sterile #313 blade was utilized to gently shave the area.  This was uncomfortable for the patient.  Following shaving of the lesion a single treatment of Cantharone solution was applied to the base of the shaved benign skin lesion.  Offloading pads were dispensed.    Will follow-up in approximately 4 to 5 weeks for recheck.  If there is still significant pain we may need to administer local anesthetic to the area and debride deeper.   Clerance Lav, DPM, FACFAS [facility]     [address] [place], [place] [address]                Office [phone]  Fax [phone]

## 2023-05-01 ENCOUNTER — Encounter: Payer: Self-pay | Admitting: Podiatry

## 2023-05-03 ENCOUNTER — Other Ambulatory Visit: Payer: Self-pay | Admitting: *Deleted

## 2023-05-03 DIAGNOSIS — Z1231 Encounter for screening mammogram for malignant neoplasm of breast: Secondary | ICD-10-CM

## 2023-05-22 ENCOUNTER — Encounter: Admitting: Podiatry

## 2023-05-25 NOTE — Progress Notes (Signed)
 Patient did not show for her scheduled appointment at 9:45 AM on Tuesday, May 22, 2023

## 2023-06-07 ENCOUNTER — Ambulatory Visit
Admission: RE | Admit: 2023-06-07 | Discharge: 2023-06-07 | Disposition: A | Discharge status: Home / Self Care | Payer: Self-pay | Source: Ambulatory Visit | Attending: *Deleted | Admitting: *Deleted

## 2023-06-07 DIAGNOSIS — Z1231 Encounter for screening mammogram for malignant neoplasm of breast: Secondary | ICD-10-CM

## 2023-07-03 ENCOUNTER — Encounter: Payer: Self-pay | Admitting: Podiatry

## 2023-07-03 ENCOUNTER — Ambulatory Visit (INDEPENDENT_AMBULATORY_CARE_PROVIDER_SITE_OTHER): Admitting: Podiatry

## 2023-07-03 ENCOUNTER — Ambulatory Visit (INDEPENDENT_AMBULATORY_CARE_PROVIDER_SITE_OTHER)

## 2023-07-03 VITALS — Ht 62.0 in | Wt 181.0 lb

## 2023-07-03 DIAGNOSIS — M7751 Other enthesopathy of right foot: Secondary | ICD-10-CM

## 2023-07-03 DIAGNOSIS — M7750 Other enthesopathy of unspecified foot: Secondary | ICD-10-CM

## 2023-07-03 DIAGNOSIS — M7742 Metatarsalgia, left foot: Secondary | ICD-10-CM

## 2023-07-03 DIAGNOSIS — M79671 Pain in right foot: Secondary | ICD-10-CM

## 2023-07-03 DIAGNOSIS — D2371 Other benign neoplasm of skin of right lower limb, including hip: Secondary | ICD-10-CM

## 2023-07-03 DIAGNOSIS — M7741 Metatarsalgia, right foot: Secondary | ICD-10-CM

## 2023-07-05 NOTE — Progress Notes (Signed)
  Subjective:  Patient ID: [name], female    DOB: 07/09/1970,  MRN: [id number]  Chief Complaint  Patient presents with   Callouses    Patient is here for a follow-up after callous removal on right foot.    53 y.o. female presents with the above complaint. History confirmed with patient.  She has a painful skin lesion on the right foot behind the fifth toe that was shaved down but is returned and painful again  Objective:  Physical Exam: warm, good capillary refill, no trophic changes or ulcerative lesions, normal DP and PT pulses, normal sensory exam, and painful benign-appearing skin lesion lateral fifth metatarsal head prominence and fluctuance on the plantar fifth metatarsal head consistent with a bursitis.   Radiographs: Multiple views x-ray of the right foot: Slight tailor's bunion deformity and plantarflexed fifth metatarsal head Assessment:   1. Bone spur of foot   2. Bursitis of right foot   3. Pain in right foot   4. Benign neoplasm of skin of right foot   5. Metatarsalgia of both feet      Plan:  Patient was evaluated and treated and all questions answered.  We reviewed her x-rays and discussed etiology and treatment options of the above conditions.  We discussed the presence of the bursitis causing the acute inflammation I recommended corticosteroid injection.  Following prep with alcohol 10 mg of Kenalog 4 mg of dexamethasone and 5 mg of Marcaine  were injected directly into the fifth metatarsal bursa.  Following this which provided local anesthetic I debrided the overlying benign skin lesion to nucleate the central core and chemical destruction with salicylic acid was applied.  Post care instructions were given.  Due to her bony deformity also recommended a custom molded foot orthosis and she will be scheduled with the orthotist for fitting for these.  Return if symptoms worsen or fail to improve.

## 2023-08-09 ENCOUNTER — Other Ambulatory Visit

## 2023-12-17 ENCOUNTER — Ambulatory Visit: Admitting: Podiatry

## 2023-12-17 ENCOUNTER — Encounter: Payer: Self-pay | Admitting: Podiatry

## 2023-12-17 DIAGNOSIS — E11628 Type 2 diabetes mellitus with other skin complications: Secondary | ICD-10-CM

## 2023-12-17 DIAGNOSIS — L84 Corns and callosities: Secondary | ICD-10-CM

## 2023-12-17 DIAGNOSIS — K5904 Chronic idiopathic constipation: Secondary | ICD-10-CM | POA: Insufficient documentation

## 2023-12-17 DIAGNOSIS — M21961 Unspecified acquired deformity of right lower leg: Secondary | ICD-10-CM

## 2023-12-17 DIAGNOSIS — M71371 Other bursal cyst, right ankle and foot: Secondary | ICD-10-CM | POA: Diagnosis not present

## 2023-12-17 NOTE — Progress Notes (Signed)
 Chief Complaint  Patient presents with   Callouses    Right foot Sub 5th met. Callus. 10 pain with pressure. Pt. Wants surgically removed. NIDDM A1C 5.2.     HPI: 53 y.o. female presents today with recurrence of a painful corn right submet 5.  She was seen by me in March 2025, and then Dr. Silva in May 2025.  He administered a cortisone injection and shave the lesion and recommended custom molded orthotics.  She did get a few pair of new shoes but still has pain.  She did not get the custom orthotics.  As she is diabetic and thinks her last A1c was around 5.3.  It is not within our [facility] system.  She is a current everyday smoker.  Past Medical History:  Diagnosis Date   Back pain    Current every day smoker    GERD (gastroesophageal reflux disease)    High cholesterol    Hypertension    Migraine    Scoliosis    Type 2 diabetes mellitus (HCC)    Past Surgical History:  Procedure Laterality Date   ABDOMINAL HYSTERECTOMY     ABDOMINAL SURGERY     BREAST BIOPSY Right 05/2021   CESAREAN SECTION     HAND SURGERY     Allergies  Allergen Reactions   Metformin  And Related Diarrhea and Nausea And Vomiting     Physical Exam: Palpable pedal pulses.  There is thick hyperkeratotic lesion with significant pain on palpation on the plantar lateral aspect of the right fifth metatarsal head.  No surrounding erythema or ulceration is noted.  No drainage is noted.  No evidence of foreign body is seen.  The fifth metatarsal appears slightly in a plantarflexed position.  There is flexible hammertoe of the fifth toe.  Epicritic sensation intact.  Assessment/Plan of Care: 1. Corns and callosities   2. Other bursal cyst, right ankle and foot   3. Metatarsal deformity, right   4. Type 2 diabetes mellitus with other skin complication, without long-term current use of insulin  (HCC)     With the patient's consent, a corticosteroid injection consisting of a mixture of 1% lidocaine  plain and Decadron   for a total of 1 cc administered, was administered to the right submet 5 area directly underneath the painful skin lesion.  Once anesthesia was confirmed, she was able to tolerate the shaving of the hyperkeratotic lesion with a sterile #313 blade.  Salinocaine ointment and a Band-Aid were applied.  A felt offloading pad was applied underneath her shoe insole.  Discussed surgical options with the patient today.  If the corn comes back, I would recommend surgical intervention due to the thickness and the amount of pain that it causes her.  I would recommend obtaining a sesamoid axial view of the right foot at the time of surgical consultation to see if she would be best served with a plantar condylectomy versus an elevation osteotomy of the fifth metatarsal.  Her smoking history would make me lean towards a plantar condylectomy.  Informed her that I will be phasing out of the [place] office and only be located in [place] in January 2026, so we will have her rescheduled with Dr. Prentice Ovens for evaluation and surgical consultation.  She expressed understanding.  I will have the patient sign a medical records release form at checkout to have her most recent blood work from her primary care provider faxed to our office for our records.  Follow-up in 7 to 8  weeks for recheck   Awanda CHARM Imperial, DPM, FACFAS [facility]     [address] [place], [place] [address]                Office [phone]  Fax [phone]

## 2024-02-08 ENCOUNTER — Institutional Professional Consult (permissible substitution)

## 2024-02-22 ENCOUNTER — Institutional Professional Consult (permissible substitution)

## 2024-02-25 ENCOUNTER — Other Ambulatory Visit: Payer: Self-pay

## 2024-03-11 ENCOUNTER — Institutional Professional Consult (permissible substitution)

## 2024-03-18 ENCOUNTER — Ambulatory Visit

## 2024-03-18 DIAGNOSIS — M21961 Unspecified acquired deformity of right lower leg: Secondary | ICD-10-CM | POA: Diagnosis not present

## 2024-03-18 DIAGNOSIS — E11628 Type 2 diabetes mellitus with other skin complications: Secondary | ICD-10-CM

## 2024-03-18 DIAGNOSIS — L84 Corns and callosities: Secondary | ICD-10-CM | POA: Diagnosis not present

## 2024-03-19 NOTE — Progress Notes (Signed)
 "  Subjective:  Patient ID: [name], female    DOB: May 30, 1970,  MRN: [id number]  Chief Complaint  Patient presents with   Foot Pain    Rm7 Surgical consult right foot.    Discussed the use of AI scribe software for clinical note transcription with the patient, who gave verbal consent to proceed.  History of Present Illness [name] is a 54 year old female with type 2 diabetes who presents for evaluation of a painful, recurrent hyperkeratotic lesion at the plantar lateral aspect of her right foot.  She has persistent pain localized to the plantar lateral aspect of her right fifth metatarsal head. The pain is exacerbated by pressure and ambulation, and has significantly limited her walking, particularly during a trip in May 2025.  The lesion is described as a buildup of thickened, hyperkeratotic skin that does not resolve with shaving or picking. She feels the tissue accumulates and causes ongoing discomfort.  She initially believed the lesion to be a corn and attempted various over-the-counter treatments without improvement. After returning from her trip, she was informed by another provider that the lesion was not a corn. The lesion has been repeatedly debrided by a provider, which provides temporary relief, but it recurs and remains painful. She has tried a clinical cytogeneticist with brief improvement, but continues to adjust her gait to avoid pressure on the area.  She has not previously used orthotic shoes or pads for offloading and regularly switches between different shoes. She is open to further attempts at offloading.     Review of Systems: Negative except as noted in the HPI. Denies N/V/F/Ch.  Past Medical History:  Diagnosis Date   Back pain    Current every day smoker    GERD (gastroesophageal reflux disease)    High cholesterol    Hypertension    Migraine    Scoliosis    Type 2 diabetes mellitus (HCC)    Current Medications[1]  Tobacco Use  History[2]  Allergies[3] Objective:  Palpable pedal pulses. There is thick hyperkeratotic lesion with significant pain on palpation on the plantar lateral aspect of the right fifth metatarsal head. Central core present in lesion. No surrounding erythema or ulceration is noted. No drainage is noted. No evidence of foreign body is seen. The fifth metatarsal appears slightly in a plantarflexed position. There is flexible hammertoe of the fifth toe. Epicritic sensation intact.   Radiographs: Previously taken.  3 weightbearing views of the right foot.  There is mild widening of the forefoot with increased hallux abductovalgus and IM 4-5 spacing.  Mild plantarflexion of the fifth metatarsal.  Mild pes planus foot shape without hindfoot arthropathy.  There is mild joint space narrowing of the first metatarsophalangeal joint.     Assessment:   1. Metatarsal deformity, right   2. Type 2 diabetes mellitus with other skin complication, without long-term current use of insulin  (HCC)   3. Corns and callosities      Plan:  Patient was evaluated and treated and all questions answered.  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 Recurrent despite prior debridement and offloading. No neurovascular compromise or significant foot deformity. Conservative offloading remains first-line; surgical intervention indicated if unsuccessful. Surgery has approximately four-week recovery until unrestricted WB is allowed and temporary work restrictions for manual labor. Smoking cessation recommended prior to surgery. - Applied metatarsal pad for  offloading. Provided additional pads and discussed use.  - Sharply debrided lesion today without incident utilizing 15 blade out of courtesy.  - Educated her on proper use of pads and shoe modifications. - Discussed conditional  plan for surgical intervention if offloading fails, including anticipated recovery and work restrictions for manual labor. Would include MIS 5th metatarsal osteotomy for offloading.  - Recommended smoking cessation prior to surgery if pursued.      No follow-ups on file.   Prentice Ovens, DPM AACFAS Fellowship Trained Podiatric Surgeon [facility]     [1]  Current Outpatient Medications:    amLODipine  (NORVASC ) 5 MG tablet, TAKE 1 TABLET (5 MG TOTAL) BY MOUTH DAILY., Disp: 90 tablet, Rfl: 0   FARXIGA 5 MG TABS tablet, Take 5 mg by mouth daily., Disp: , Rfl:    Galcanezumab -gnlm (EMGALITY ) 120 MG/ML SOAJ, Inject 120 mg into the skin every 28 (twenty-eight) days., Disp: 1.12 mL, Rfl: 5   Insulin  Pen Needle (UNIFINE PENTIPS) 31G X 8 MM MISC, Use as directed., Disp: 100 each, Rfl: 1   lisinopril  (ZESTRIL ) 40 MG tablet, TAKE 1 TABLET BY MOUTH EVERY DAY, Disp: 90 tablet, Rfl: 1   oxybutynin  (DITROPAN -XL) 5 MG 24 hr tablet, TAKE 1 TABLET BY MOUTH EVERYDAY AT BEDTIME, Disp: 30 tablet, Rfl: 1   potassium chloride  SA (KLOR-CON  M20) 20 MEQ tablet, TAKE ONE TWICE DAILY FOR 1 WEEK AND THEN ONE DAILY., Disp: 90 tablet, Rfl: 1   rizatriptan  (MAXALT -MLT) 10 MG disintegrating tablet, TAKE 1 TABLET EARLIEST ONSET OF MIGRAINE. MAY REPEAT IN 2 HOURS IF NEEDED. MAXIMUM 2 TABS 24 HOURS, Disp: 12 tablet, Rfl: 2   rosuvastatin  (CRESTOR ) 10 MG tablet, TAKE 1 TABLET BY MOUTH EVERY DAY, Disp: 90 tablet, Rfl: 0   Semaglutide  (RYBELSUS ) 7 MG TABS, TAKE 7 MG BY MOUTH DAILY., Disp: 30 tablet, Rfl: 1   topiramate  (TOPAMAX ) 50 MG tablet, TAKE 1 TABLET BY MOUTH TWICE A DAY, Disp: 180 tablet, Rfl: 1   Vitamin D, Ergocalciferol, (DRISDOL) 1.25 MG (50000 UNIT) CAPS capsule, Take 50,000 Units by mouth once a week., Disp: , Rfl:    meloxicam  (MOBIC ) 15 MG tablet, Take 1 tablet (15 mg total) by mouth daily. (Patient not taking: Reported on 03/18/2024), Disp: 30 tablet, Rfl: 1 [2]  Social History Tobacco Use   Smoking Status Every Day   Current packs/day: 1.00   Average packs/day: 1 pack/day for 15.0 years (15.0 ttl pk-yrs)   Types: Cigarettes  Smokeless Tobacco Never  [3]  Allergies Allergen Reactions   Metformin  And Related Diarrhea and Nausea And Vomiting   "
